# Patient Record
Sex: Female | Born: 1955 | Hispanic: No | Marital: Married | Smoking: Never smoker
Health system: Southern US, Community
[De-identification: ages and names within clinical notes are randomized; demographics above are authoritative.]

## PROBLEM LIST (undated history)

## (undated) HISTORY — PX: NO PAST SURGERIES: SHX2092

## (undated) HISTORY — PX: COLONOSCOPY: SHX174

---

## 2004-05-22 ENCOUNTER — Ambulatory Visit (HOSPITAL_COMMUNITY): Admission: RE | Admit: 2004-05-22 | Discharge: 2004-05-22 | Payer: Self-pay | Admitting: Internal Medicine

## 2005-04-17 ENCOUNTER — Emergency Department (HOSPITAL_COMMUNITY): Admission: EM | Admit: 2005-04-17 | Discharge: 2005-04-17 | Payer: Self-pay | Admitting: Emergency Medicine

## 2006-05-08 IMAGING — CR DG CHEST 2V
2 series · 2 of 2 positions shown · non-contrast
Comparison: None.

CLINICAL DATA: Cough.  Upper respiratory infection.
 CHEST ? 2 VIEW:

[view not recorded (1 of 2)]
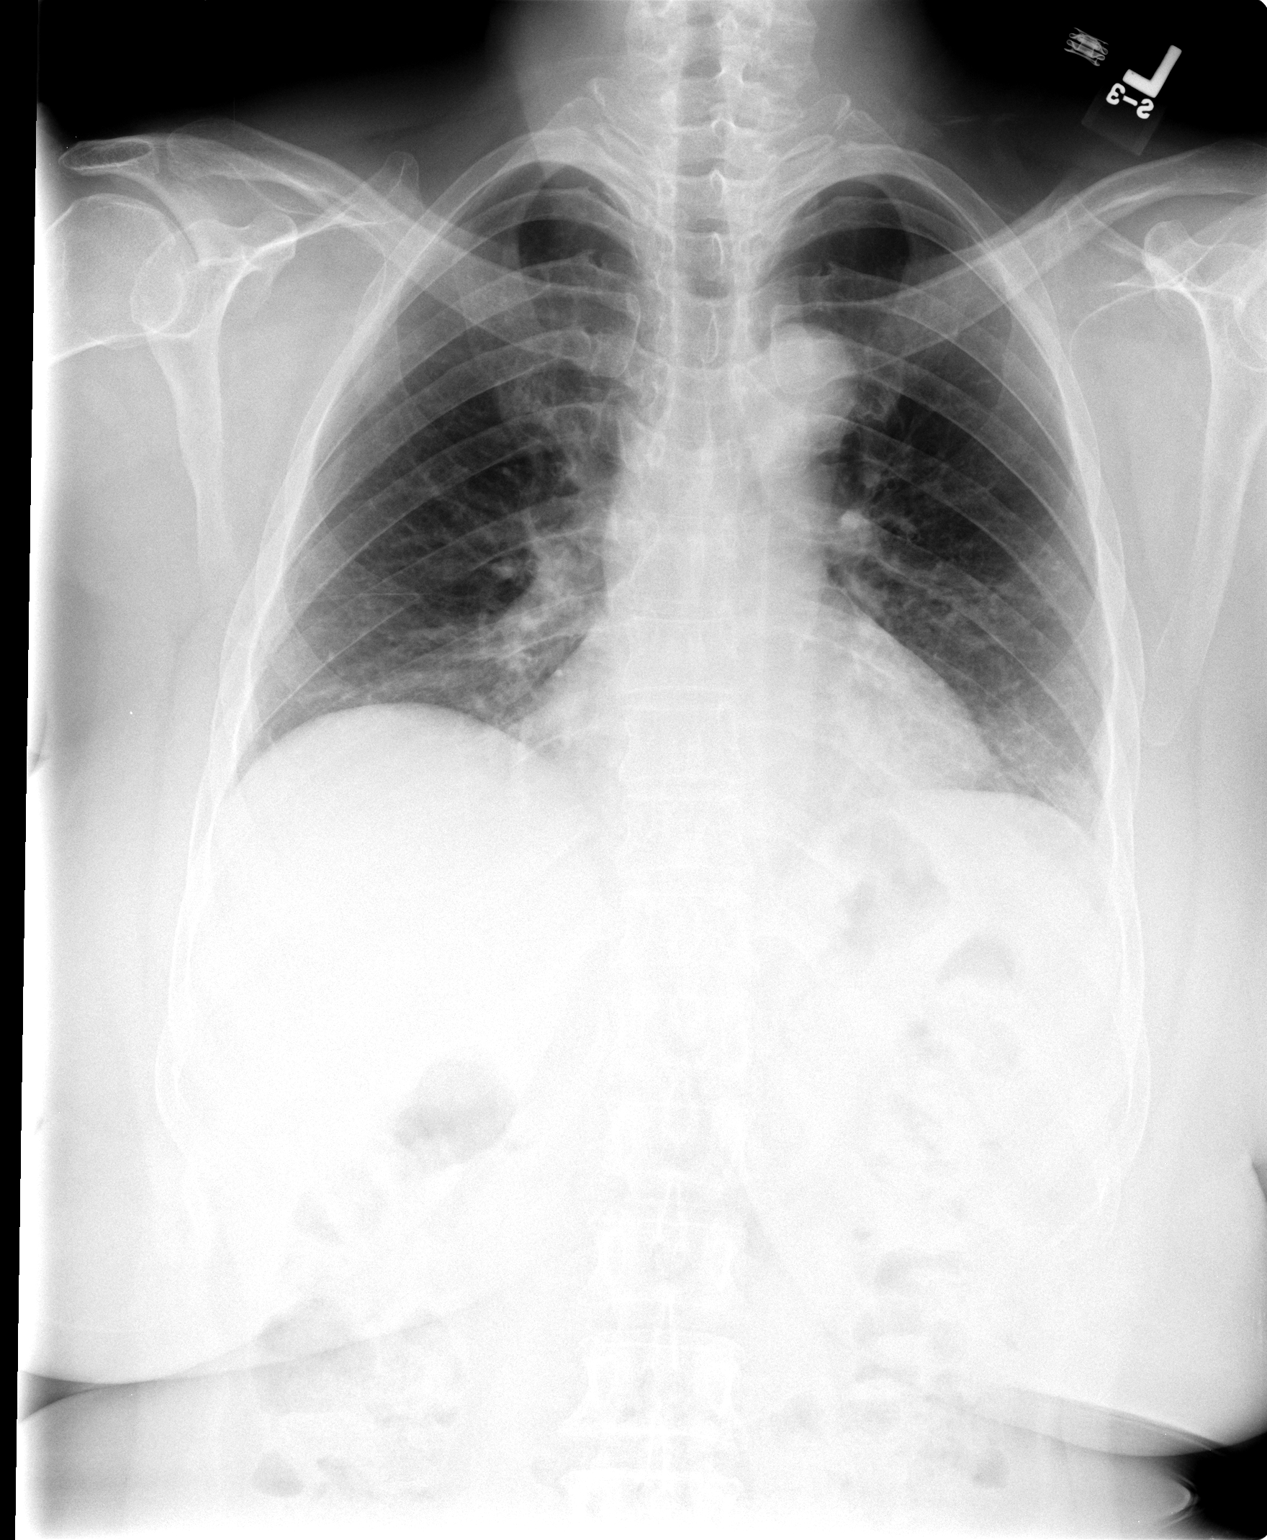

[view not recorded (2 of 2)]
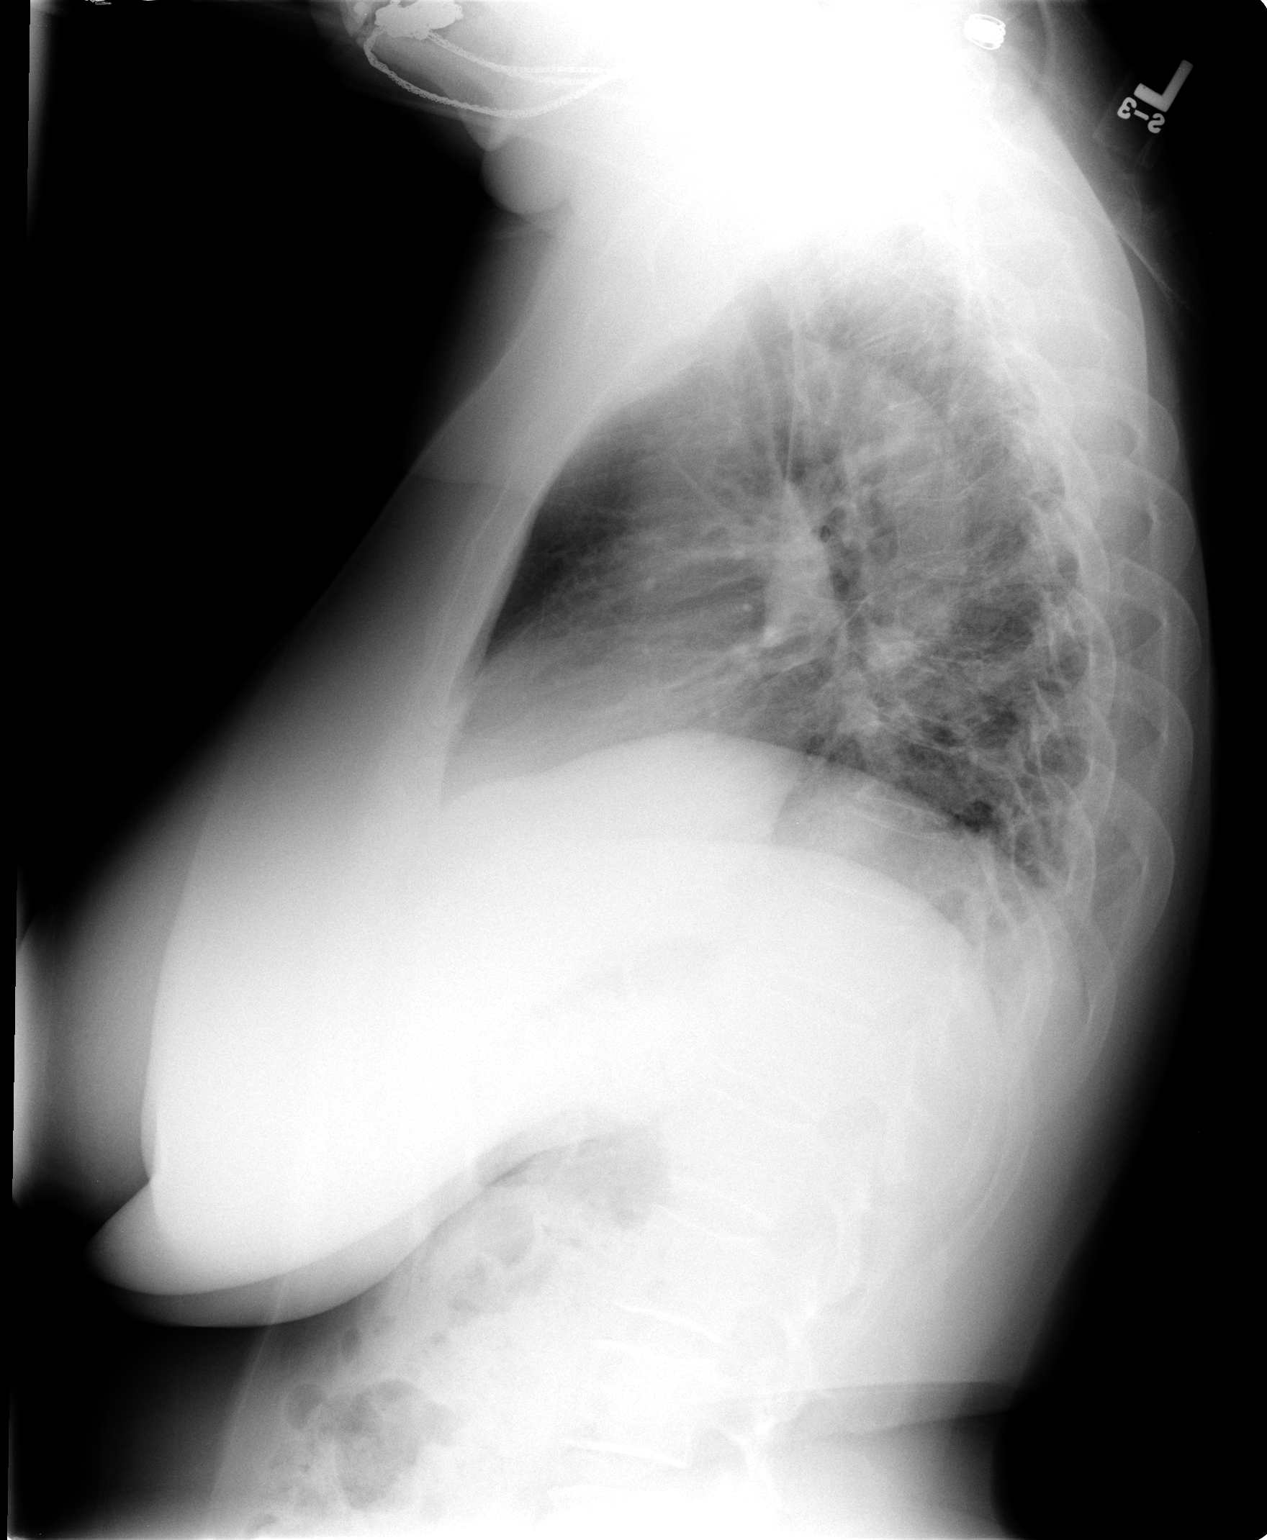

[2 of 2 positions shown; findings below may reference images not displayed]

FINDINGS: Low lung volumes are seen.  Mild bibasilar infiltrates versus atelectasis are noted.  There is no evidence of pleural effusion.  Heart size and mediastinal contours are within normal limits.
IMPRESSION: Low lung volumes with mild bibasilar atelectasis versus infiltrates.

## 2006-11-26 ENCOUNTER — Emergency Department (HOSPITAL_COMMUNITY): Admission: EM | Admit: 2006-11-26 | Discharge: 2006-11-26 | Payer: Self-pay | Admitting: Emergency Medicine

## 2010-05-05 ENCOUNTER — Encounter: Payer: Self-pay | Admitting: Internal Medicine

## 2014-05-02 ENCOUNTER — Emergency Department (HOSPITAL_COMMUNITY)
Admission: EM | Admit: 2014-05-02 | Discharge: 2014-05-02 | Disposition: A | Discharge status: Home / Self Care | Payer: Self-pay | Attending: Emergency Medicine | Admitting: Emergency Medicine

## 2014-05-02 ENCOUNTER — Encounter (HOSPITAL_COMMUNITY): Payer: Self-pay | Admitting: Family Medicine

## 2014-05-02 DIAGNOSIS — K5900 Constipation, unspecified: Secondary | ICD-10-CM | POA: Insufficient documentation

## 2014-05-02 DIAGNOSIS — K644 Residual hemorrhoidal skin tags: Secondary | ICD-10-CM | POA: Insufficient documentation

## 2014-05-02 DIAGNOSIS — Z79899 Other long term (current) drug therapy: Secondary | ICD-10-CM | POA: Insufficient documentation

## 2014-05-02 DIAGNOSIS — K625 Hemorrhage of anus and rectum: Secondary | ICD-10-CM | POA: Insufficient documentation

## 2014-05-02 LAB — COMPREHENSIVE METABOLIC PANEL
ALK PHOS: 52 U/L (ref 39–117)
ALT: 15 U/L (ref 0–35)
ANION GAP: 8 (ref 5–15)
AST: 21 U/L (ref 0–37)
Albumin: 3.7 g/dL (ref 3.5–5.2)
BILIRUBIN TOTAL: 0.6 mg/dL (ref 0.3–1.2)
BUN: 11 mg/dL (ref 6–23)
CHLORIDE: 103 meq/L (ref 96–112)
CO2: 28 mmol/L (ref 19–32)
CREATININE: 1.06 mg/dL (ref 0.50–1.10)
Calcium: 9.2 mg/dL (ref 8.4–10.5)
GFR, EST AFRICAN AMERICAN: 66 mL/min — AB (ref >90)
GFR, EST NON AFRICAN AMERICAN: 57 mL/min — AB (ref >90)
GLUCOSE: 120 mg/dL — AB (ref 70–99)
POTASSIUM: 4 mmol/L (ref 3.5–5.1)
Sodium: 139 mmol/L (ref 135–145)
TOTAL PROTEIN: 7.3 g/dL (ref 6.0–8.3)

## 2014-05-02 LAB — POC OCCULT BLOOD, ED: Fecal Occult Bld: POSITIVE — AB

## 2014-05-02 LAB — CBC
HCT: 42.4 % (ref 36.0–46.0)
HEMOGLOBIN: 14.4 g/dL (ref 12.0–15.0)
MCH: 28.9 pg (ref 26.0–34.0)
MCHC: 34 g/dL (ref 30.0–36.0)
MCV: 85 fL (ref 78.0–100.0)
Platelets: 188 10*3/uL (ref 150–400)
RBC: 4.99 MIL/uL (ref 3.87–5.11)
RDW: 13.8 % (ref 11.5–15.5)
WBC: 5.7 10*3/uL (ref 4.0–10.5)

## 2014-05-02 MED ORDER — FENTANYL CITRATE 0.05 MG/ML IJ SOLN: 50.0000 ug | Freq: Once | INTRAMUSCULAR | Status: DC

## 2014-05-02 NOTE — ED Notes (Signed)
Pt here for mid abd pain and 2 episodes of rectal bleeding. Bright red.

## 2014-05-02 NOTE — ED Provider Notes (Signed)
CSN: [id number]     Arrival date & time 05/02/14  1137 History   First MD Initiated Contact with Patient 05/02/14 1257     Chief Complaint  Patient presents with  . Rectal Bleeding  . Abdominal Pain     (Consider location/radiation/quality/duration/timing/severity/associated sxs/prior Treatment) Patient is a 59 y.o. female presenting with hematochezia and abdominal pain.  Rectal Bleeding Associated symptoms: no abdominal pain, no dizziness, no fever and no vomiting   Abdominal Pain Associated symptoms: constipation and hematochezia   Associated symptoms: no chest pain, no diarrhea, no dysuria, no fever, no nausea, no shortness of breath and no vomiting    Kristin Waller is a 59 year old female with no known past medical history who presents to the ER complaining of rectal bleeding. Patient reports one episode of bright red blood in the toilet this morning in addition to formed stool. She reports a second bowel movement after where she did not notice any blood. Patient reports a mild discomfort in her middle abdomen, however does not report any abdominal pain, nausea, vomiting, diarrhea, fever, weakness, lightheadedness, dizziness, chest pain, shortness of breath, dysuria. Patient reports she has been constipated for the past 2 days.  History reviewed. No pertinent past medical history. History reviewed. No pertinent past surgical history. History reviewed. No pertinent family history. History  Substance Use Topics  . Smoking status: Never Smoker   . Smokeless tobacco: Not on file  . Alcohol Use: No   OB History    No data available     Review of Systems  Constitutional: Negative for fever.  HENT: Negative for trouble swallowing.   Eyes: Negative for visual disturbance.  Respiratory: Negative for shortness of breath.   Cardiovascular: Negative for chest pain.  Gastrointestinal: Positive for constipation, blood in stool, hematochezia and anal bleeding. Negative for nausea,  vomiting, abdominal pain, diarrhea and rectal pain.  Genitourinary: Negative for dysuria.  Musculoskeletal: Negative for neck pain.  Skin: Negative for rash.  Neurological: Negative for dizziness, weakness and numbness.  Psychiatric/Behavioral: Negative.       Allergies  Review of patient's allergies indicates no known allergies.  Home Medications   Prior to Admission medications   Medication Sig Start Date End Date Taking? Authorizing Provider  Multiple Vitamins-Minerals (MULTIVITAMIN WITH MINERALS) tablet Take 1 tablet by mouth daily.   Yes Historical Provider, MD   BP 108/84 mmHg  Pulse 56  Temp(Src) 97.7 F (36.5 C) (Oral)  Resp 18  SpO2 97% Physical Exam  Constitutional: She is oriented to person, place, and time. She appears well-developed and well-nourished. No distress.  HENT:  Head: Normocephalic and atraumatic.  Mouth/Throat: Oropharynx is clear and moist. No oropharyngeal exudate.  Eyes: Right eye exhibits no discharge. Left eye exhibits no discharge. No scleral icterus.  Neck: Normal range of motion.  Cardiovascular: Normal rate, regular rhythm and normal heart sounds.   No murmur heard. Pulmonary/Chest: Effort normal and breath sounds normal. No respiratory distress.  Abdominal: Soft. There is no tenderness.  Genitourinary: Rectal exam shows external hemorrhoid. Rectal exam shows no internal hemorrhoid, no fissure, no mass, no tenderness and anal tone normal. Guaiac positive stool.  Obvious external hemorrhoids noted at 10:00 and 5:00 position the rectum. Red tint noted to stool without frank blood noted.  Musculoskeletal: Normal range of motion. She exhibits no edema or tenderness.  Neurological: She is alert and oriented to person, place, and time. She has normal strength. No cranial nerve deficit or sensory deficit. Coordination normal. GCS eye subscore  is 4. GCS verbal subscore is 5. GCS motor subscore is 6.  Patient fully alert answering questions  appropriately in full, clear sentences. Cranial nerves II through XII grossly intact. Motor strength 5 out of 5 in all major muscle groups of upper and lower extremities. Distal sensation intact.  Skin: Skin is warm and dry. No rash noted. She is not diaphoretic.  Psychiatric: She has a normal mood and affect.  Nursing note and vitals reviewed.   ED Course  Procedures (including critical care time) Labs Review Labs Reviewed  COMPREHENSIVE METABOLIC PANEL - Abnormal; Notable for the following:    Glucose, Bld 120 (*)    GFR calc non Af Amer 57 (*)    GFR calc Af Amer 66 (*)    All other components within normal limits  POC OCCULT BLOOD, ED - Abnormal; Notable for the following:    Fecal Occult Bld POSITIVE (*)    All other components within normal limits  CBC    Imaging Review No results found.   EKG Interpretation None      MDM   Final diagnoses:  Rectal bleeding    Patient here for evaluation of one episode of rectal bleeding. Patient was Hemoccult positive, although several external hemorrhoids noted on exam. Patient describes her blood as a bright red in color. Lab work reassuring. Patient without leukocytosis or anemia. Patient well-appearing, non-tachycardic, nontachypneic, non-hypoxic, afebrile and in no acute distress. No concern for SIRS or sepsis. No concern for acute abdomen. I spoke with Dr. Lucrezia Europe with [facility] who recommends patient to follow-up in their office on Thursday. I discussed this recommendation with patient, and patient was agreeable to this plan. I also discussed return precautions with patient, and patient and her daughter verbalize understanding and agreement. I encouraged them to call or return to the ER should she have any worsening of symptoms or should they have any questions or concerns.  Patient seen and discussed with Dr. Geoffery Lyons, MD  BP 108/84 mmHg  Pulse 56  Temp(Src) 97.7 F (36.5 C) (Oral)  Resp 18  SpO2 97%  Signed,   Ladona Mow, PA-C 5:08 PM   Monte Fantasia, PA-C 05/02/14 1708  Geoffery Lyons, MD 05/03/14 1002

## 2014-05-02 NOTE — Discharge Instructions (Signed)
Follow-up with gastroenterology on Thursday, 05/05/14 at 9:30 AM. Return to the ER if any weakness, dizziness, headache, severe abdominal pain, severe bleeding, nausea, vomiting, diarrhea, high fever.  Rectal Bleeding Rectal bleeding is when blood passes out of the anus. It is usually a sign that something is wrong. It may not be serious, but it should always be evaluated. Rectal bleeding may present as bright red blood or extremely dark stools. The color may range from dark red or maroon to black (like tar). It is important that the cause of rectal bleeding be identified so treatment can be started and the problem corrected. CAUSES   Hemorrhoids. These are enlarged (dilated) blood vessels or veins in the anal or rectal area.  Fistulas. Theseare abnormal, burrowing channels that usually run from inside the rectum to the skin around the anus. They can bleed.  Anal fissures. This is a tear in the tissue of the anus. Bleeding occurs with bowel movements.  Diverticulosis. This is a condition in which pockets or sacs project from the bowel wall. Occasionally, the sacs can bleed.  Diverticulitis. Thisis an infection involving diverticulosis of the colon.  Proctitis and colitis. These are conditions in which the rectum, colon, or both, can become inflamed and pitted (ulcerated).  Polyps and cancer. Polyps are non-cancerous (benign) growths in the colon that may bleed. Certain types of polyps turn into cancer.  Protrusion of the rectum. Part of the rectum can project from the anus and bleed.  Certain medicines.  Intestinal infections.  Blood vessel abnormalities. HOME CARE INSTRUCTIONS  Eat a high-fiber diet to keep your stool soft.  Limit activity.  Drink enough fluids to keep your urine clear or pale yellow.  Warm baths may be useful to soothe rectal pain.  Follow up with your caregiver as directed. SEEK IMMEDIATE MEDICAL CARE IF:  You develop increased bleeding.  You have black  or dark red stools.  You vomit blood or material that looks like coffee grounds.  You have abdominal pain or tenderness.  You have a fever.  You feel weak, nauseous, or you faint.  You have severe rectal pain or you are unable to have a bowel movement. MAKE SURE YOU:  Understand these instructions.  Will watch your condition.  Will get help right away if you are not doing well or get worse. Document Released: 09/21/2001 Document Revised: 06/24/2011 Document Reviewed: 09/16/2010 [facility] Patient Information 2015 [place]. This information is not intended to replace advice given to you by your health care provider. Make sure you discuss any questions you have with your health care provider.

## 2014-05-05 ENCOUNTER — Ambulatory Visit (INDEPENDENT_AMBULATORY_CARE_PROVIDER_SITE_OTHER): Payer: Self-pay | Admitting: Physician Assistant

## 2014-05-05 ENCOUNTER — Encounter: Payer: Self-pay | Admitting: Physician Assistant

## 2014-05-05 VITALS — BP 118/78 | HR 60 | Ht 62.5 in | Wt 179.2 lb

## 2014-05-05 DIAGNOSIS — K625 Hemorrhage of anus and rectum: Secondary | ICD-10-CM

## 2014-05-05 DIAGNOSIS — K649 Unspecified hemorrhoids: Secondary | ICD-10-CM

## 2014-05-05 MED ORDER — MOVIPREP 100 G PO SOLR: 1.0000 | Freq: Once | ORAL | Status: DC

## 2014-05-05 MED ORDER — HYDROCORTISONE 2.5 % RE CREA: 1.0000 "application " | TOPICAL_CREAM | Freq: Two times a day (BID) | RECTAL | Status: DC

## 2014-05-05 NOTE — Progress Notes (Signed)
Reviewed and agree with management. Robert D. Kaplan, M.D., FACG  

## 2014-05-05 NOTE — Patient Instructions (Addendum)
You have been scheduled for a colon on 05-09-14.  We sent medication to your pharmacy and please use Tucks wipes as needed.     Hemorrhoids Hemorrhoids are swollen veins around the rectum or anus. There are two types of hemorrhoids:   Internal hemorrhoids. These occur in the veins just inside the rectum. They may poke through to the outside and become irritated and painful.  External hemorrhoids. These occur in the veins outside the anus and can be felt as a painful swelling or hard lump near the anus. CAUSES  Pregnancy.   Obesity.   Constipation or diarrhea.   Straining to have a bowel movement.   Sitting for long periods on the toilet.  Heavy lifting or other activity that caused you to strain.  Anal intercourse. SYMPTOMS   Pain.   Anal itching or irritation.   Rectal bleeding.   Fecal leakage.   Anal swelling.   One or more lumps around the anus.  DIAGNOSIS  Your caregiver may be able to diagnose hemorrhoids by visual examination. Other examinations or tests that may be performed include:   Examination of the rectal area with a gloved hand (digital rectal exam).   Examination of anal canal using a small tube (scope).   A blood test if you have lost a significant amount of blood.  A test to look inside the colon (sigmoidoscopy or colonoscopy). TREATMENT Most hemorrhoids can be treated at home. However, if symptoms do not seem to be getting better or if you have a lot of rectal bleeding, your caregiver may perform a procedure to help make the hemorrhoids get smaller or remove them completely. Possible treatments include:   Placing a rubber band at the base of the hemorrhoid to cut off the circulation (rubber band ligation).   Injecting a chemical to shrink the hemorrhoid (sclerotherapy).   Using a tool to burn the hemorrhoid (infrared light therapy).   Surgically removing the hemorrhoid (hemorrhoidectomy).   Stapling the hemorrhoid to  block blood flow to the tissue (hemorrhoid stapling).  HOME CARE INSTRUCTIONS   Eat foods with fiber, such as whole grains, beans, nuts, fruits, and vegetables. Ask your doctor about taking products with added fiber in them (fibersupplements).  Increase fluid intake. Drink enough water and fluids to keep your urine clear or pale yellow.   Exercise regularly.   Go to the bathroom when you have the urge to have a bowel movement. Do not wait.   Avoid straining to have bowel movements.   Keep the anal area dry and clean. Use wet toilet paper or moist towelettes after a bowel movement.   Medicated creams and suppositories may be used or applied as directed.   Only take over-the-counter or prescription medicines as directed by your caregiver.   Take warm sitz baths for 15-20 minutes, 3-4 times a day to ease pain and discomfort.   Place ice packs on the hemorrhoids if they are tender and swollen. Using ice packs between sitz baths may be helpful.   Put ice in a plastic bag.   Place a towel between your skin and the bag.   Leave the ice on for 15-20 minutes, 3-4 times a day.   Do not use a donut-shaped pillow or sit on the toilet for long periods. This increases blood pooling and pain.  SEEK MEDICAL CARE IF:  You have increasing pain and swelling that is not controlled by treatment or medicine.  You have uncontrolled bleeding.  You have difficulty or  you are unable to have a bowel movement.  You have pain or inflammation outside the area of the hemorrhoids. MAKE SURE YOU:  Understand these instructions.  Will watch your condition.  Will get help right away if you are not doing well or get worse. Document Released: 03/29/2000 Document Revised: 03/18/2012 Document Reviewed: 02/04/2012 [facility] Patient Information 2015 [place], [place]. This information is not intended to replace advice given to you by your health care provider. Make sure you discuss any questions  you have with your health care provider.

## 2014-05-05 NOTE — Progress Notes (Signed)
Patient ID: Kristin Waller, female   DOB: 1956/01/12, 59 y.o.   MRN: [id number]    HPI:    Kristin Waller is Waller 59 year old [place] female referred for evaluation by Dr. Stark Jock due to rectal bleeding.  Patient speaks broken [place] and is accompanied by her daughter who translates for her. Patient has no past medical history and presented to the [facility] emergency room on January 19 with complaints of rectal bleeding. The patient states that 2 weeks prior she had had Waller bowel movement and had bright red blood on the toilet tissue. On the 19th she had Waller bowel movements and says blood filled the toilet bowl. She had no rectal pain, itching, or burning, she does have Waller sensation of incomplete evacuation. She has no abdominal pain. She has had no change in her bowel habits or stool caliber. There is no known family history of colon cancer, colon polyps, or inflammatory bowel disease. Tight has been good and her weight has been stable.     Past Surgical History  Procedure Laterality Date  . No past surgeries     Family History  Problem Relation Age of Onset  . Colon cancer Neg Hx   . Colon polyps Neg Hx   . Diabetes Neg Hx   . Kidney disease Neg Hx   . Gallbladder disease Neg Hx   . Esophageal cancer Neg Hx    History  Substance Use Topics  . Smoking status: Never Smoker   . Smokeless tobacco: Never Used  . Alcohol Use: No   Current Outpatient Prescriptions  Medication Sig Dispense Refill  . Multiple Vitamins-Minerals (MULTIVITAMIN WITH MINERALS) tablet Take 1 tablet by mouth daily.    . hydrocortisone (ANUSOL-HC) 2.5 % rectal cream Place 1 application rectally 2 (two) times daily. 30 g 1  . MOVIPREP 100 G SOLR Take 1 kit (200 g total) by mouth once. 1 kit 0   No current facility-administered medications for this visit.   No Known Allergies   Review of Systems: Gen: Denies any fever, chills, sweats, anorexia, fatigue, weakness, malaise, weight loss, and sleep disorder CV: Denies  chest pain, angina, palpitations, syncope, orthopnea, PND, peripheral edema, and claudication. Resp: Denies dyspnea at rest, dyspnea with exercise, cough, sputum, wheezing, coughing up blood, and pleurisy. GI: Denies vomiting blood, jaundice, and fecal incontinence.   Denies dysphagia or odynophagia. GU : Denies urinary burning, blood in urine, urinary frequency, urinary hesitancy, nocturnal urination, and urinary incontinence. MS: Denies joint pain, limitation of movement, and swelling, stiffness, low back pain, extremity pain. Denies muscle weakness, cramps, atrophy.  Derm: Denies rash, itching, dry skin, hives, moles, warts, or unhealing ulcers.  Psych: Denies depression, anxiety, memory loss, suicidal ideation, hallucinations, paranoia, and confusion. Heme: Denies bruising, bleeding, and enlarged lymph nodes. Neuro:  Denies any headaches, dizziness, paresthesias. Endo:  Denies any problems with DM, thyroid, adrenal function  Studies: No results found.  LAB RESULTS:  Recent Labs  05/02/14 1144  WBC 5.7  HGB 14.4  HCT 42.4  PLT 188   BMET  Recent Labs  05/02/14 1144  NA 139  K 4.0  CL 103  CO2 28  GLUCOSE 120*  BUN 11  CREATININE 1.06  CALCIUM 9.2   LFT  Recent Labs  05/02/14 1144  PROT 7.3  ALBUMIN 3.7  AST 21  ALT 15  ALKPHOS 52  BILITOT 0.6      Physical Exam: BP 118/78 mmHg  Pulse 60  Ht 5' 2.5" (1.588  m)  Wt 179 lb 4 oz (81.307 kg)  BMI 32.24 kg/m2 Constitutional: Pleasant,well-developed female in no acute distress. HEENT: Normocephalic and atraumatic. Conjunctivae are normal. No scleral icterus. Neck supple. No thyromegaly Cardiovascular: Normal rate, regular rhythm.  Pulmonary/chest: Effort normal and breath sounds normal. No wheezing, rales or rhonchi. Abdominal: Soft, nondistended, nontender. Bowel sounds active throughout. There are no masses palpable. No hepatomegaly. Rectal: external skin tag and hemorrhoid, small internal hemorrhoid,  brown stool tests heme positive Extremities: no edema Lymphadenopathy: No cervical adenopathy noted. Neurological: Alert and oriented to person place and time. Skin: Skin is warm and dry. No rashes noted. Psychiatric: Normal mood and affect. Behavior is normal.  ASSESSMENT AND PLAN: 59 year old female status post an episode of rectal bleeding found to have internal and external hemorrhoids and heme-positive stools referred for evaluation. She has been instructed to use Tucks wipes after bowel movements. She will be given Waller trial of Anusol HC suppositories 1 per rectum twice Waller day for 10 days. She will be scheduled for colonoscopy to screen for polyps, neoplasia, or inflammatory bowel disease.The risks, benefits, and alternatives to colonoscopy with possible biopsy and possible polypectomy were discussed with the patient and they consent to proceed. The procedure will be scheduled with Dr. Deatra Ina. Further recommendations will be made pending the findings of her colonoscopy.    ,  P PA-C 05/05/2014, 11:00 AM

## 2014-05-09 ENCOUNTER — Encounter: Payer: Self-pay | Admitting: Gastroenterology

## 2014-05-09 ENCOUNTER — Ambulatory Visit (AMBULATORY_SURGERY_CENTER): Payer: Self-pay | Admitting: Gastroenterology

## 2014-05-09 VITALS — BP 104/60 | HR 53 | Temp 98.1°F | Resp 22 | Ht 62.0 in | Wt 179.0 lb

## 2014-05-09 DIAGNOSIS — K625 Hemorrhage of anus and rectum: Secondary | ICD-10-CM

## 2014-05-09 DIAGNOSIS — K648 Other hemorrhoids: Secondary | ICD-10-CM

## 2014-05-09 DIAGNOSIS — K642 Third degree hemorrhoids: Secondary | ICD-10-CM

## 2014-05-09 MED ORDER — SODIUM CHLORIDE 0.9 % IV SOLN: 500.0000 mL | INTRAVENOUS | Status: DC

## 2014-05-09 NOTE — Progress Notes (Signed)
Procedure ends, to recovery, report given and VSS. 

## 2014-05-09 NOTE — Patient Instructions (Signed)
Handouts given on hemorrhoids. Repeat colonoscopy in 10 years.   YOU HAD AN ENDOSCOPIC PROCEDURE TODAY AT THE [facility]: Refer to the procedure report that was given to you for any specific questions about what was found during the examination.  If the procedure report does not answer your questions, please call your gastroenterologist to clarify.  If you requested that your care partner not be given the details of your procedure findings, then the procedure report has been included in a sealed envelope for you to review at your convenience later.  YOU SHOULD EXPECT: Some feelings of bloating in the abdomen. Passage of more gas than usual.  Walking can help get rid of the air that was put into your GI tract during the procedure and reduce the bloating. If you had a lower endoscopy (such as a colonoscopy or flexible sigmoidoscopy) you may notice spotting of blood in your stool or on the toilet paper. If you underwent a bowel prep for your procedure, then you may not have a normal bowel movement for a few days.  DIET: Your first meal following the procedure should be a light meal and then it is ok to progress to your normal diet.  A half-sandwich or bowl of soup is an example of a good first meal.  Heavy or fried foods are harder to digest and may make you feel nauseous or bloated.  Likewise meals heavy in dairy and vegetables can cause extra gas to form and this can also increase the bloating.  Drink plenty of fluids but you should avoid alcoholic beverages for 24 hours.  ACTIVITY: Your care partner should take you home directly after the procedure.  You should plan to take it easy, moving slowly for the rest of the day.  You can resume normal activity the day after the procedure however you should NOT DRIVE or use heavy machinery for 24 hours (because of the sedation medicines used during the test).    SYMPTOMS TO REPORT IMMEDIATELY: A gastroenterologist can be reached at any hour.  During  normal business hours, 8:30 AM to 5:00 PM Monday through Friday, call [phone].  After hours and on weekends, please call the GI answering service at [phone] who will take a message and have the physician on call contact you.   Following lower endoscopy (colonoscopy or flexible sigmoidoscopy):  Excessive amounts of blood in the stool  Significant tenderness or worsening of abdominal pains  Swelling of the abdomen that is new, acute  Fever of 100F or higher  Following upper endoscopy (EGD)  Vomiting of blood or coffee ground material  New chest pain or pain under the shoulder blades  Painful or persistently difficult swallowing  New shortness of breath  Fever of 100F or higher  Black, tarry-looking stools  FOLLOW UP: If any biopsies were taken you will be contacted by phone or by letter within the next 1-3 weeks.  Call your gastroenterologist if you have not heard about the biopsies in 3 weeks.  Our staff will call the home number listed on your records the next business day following your procedure to check on you and address any questions or concerns that you may have at that time regarding the information given to you following your procedure. This is a courtesy call and so if there is no answer at the home number and we have not heard from you through the emergency physician on call, we will assume that you have returned to your  regular daily activities without incident.  SIGNATURES/CONFIDENTIALITY: You and/or your care partner have signed paperwork which will be entered into your electronic medical record.  These signatures attest to the fact that that the information above on your After Visit Summary has been reviewed and is understood.  Full responsibility of the confidentiality of this discharge information lies with you and/or your care-partner.

## 2014-05-09 NOTE — Op Note (Signed)
[facility] [address] [place], [id number]   COLONOSCOPY PROCEDURE REPORT  PATIENT: Kristin Waller, Kristin Waller  MR#: [id number] BIRTHDATE: March 29, 1956 , 58  yrs. old GENDER: female ENDOSCOPIST: Louis Meckelobert D , MD REFERRED BY: PROCEDURE DATE:  05/09/2014 PROCEDURE:   Colonoscopy, diagnostic First Screening Colonoscopy - Avg.  risk and is 50 yrs.  old or older Yes.  Prior Negative Screening - Now for repeat screening. N/A  History of Adenoma - Now for follow-up colonoscopy & has been > or = to 3 yrs.  N/A  Polyps Removed Today? No.  Recommend repeat exam, <10 yrs? No. ASA CLASS:   Class I INDICATIONS:anal bleeding. MEDICATIONS: Monitored anesthesia care and Propofol 180 mg IV  DESCRIPTION OF PROCEDURE:   After the risks benefits and alternatives of the procedure were thoroughly explained, informed consent was obtained.  The digital rectal exam revealed hemorrhoids, Grade III.   The LB [id number]  endoscope was introduced through the anus and advanced to the cecum, which was identified by both the appendix and ileocecal valve. No adverse events experienced.   The quality of the prep was excellent using Suprep  The instrument was then slowly withdrawn as the colon was fully examined.      COLON FINDINGS: Internal Grade III hemorrhoids were found.   The examination was otherwise normal.  Retroflexed views revealed no abnormalities. The time to cecum=2 minutes 27 seconds.  Withdrawal time=6 minutes 01 seconds.  The scope was withdrawn and the procedure completed. COMPLICATIONS: There were no immediate complications.  ENDOSCOPIC IMPRESSION: 1.   Internal Grade III hemorrhoids 2.   The examination was otherwise normal  RECOMMENDATIONS: Anusol suppositories To consider band ligation of internal hemorrhoids for persistent bleeding Colonoscopy 10 years  eSigned:  Louis Meckelobert D , MD 05/09/2014 3:38 PM   cc:   PATIENT NAME:  Kristin Waller, Kristin Waller MR#:  [id number]

## 2014-05-10 ENCOUNTER — Telehealth: Payer: Self-pay | Admitting: *Deleted

## 2014-05-10 NOTE — Telephone Encounter (Signed)
  Follow up Call-no answer, left message to call if questions or concerns.     

## 2014-10-13 ENCOUNTER — Ambulatory Visit
Admission: RE | Admit: 2014-10-13 | Discharge: 2014-10-13 | Disposition: A | Discharge status: Home / Self Care | Payer: No Typology Code available for payment source | Source: Ambulatory Visit | Attending: Infectious Disease | Admitting: Infectious Disease

## 2014-10-13 ENCOUNTER — Other Ambulatory Visit: Payer: Self-pay | Admitting: Infectious Disease

## 2014-10-13 DIAGNOSIS — R7611 Nonspecific reaction to tuberculin skin test without active tuberculosis: Secondary | ICD-10-CM

## 2015-11-03 IMAGING — CR DG CHEST 1V
1 series · 1 of 1 positions shown · non-contrast
Comparison: PA and lateral chest of April 17, 2005

CLINICAL DATA: Positive PPD, asymptomatic, nonsmoker.

EXAM:
CHEST  1 VIEW

[w chest pa]
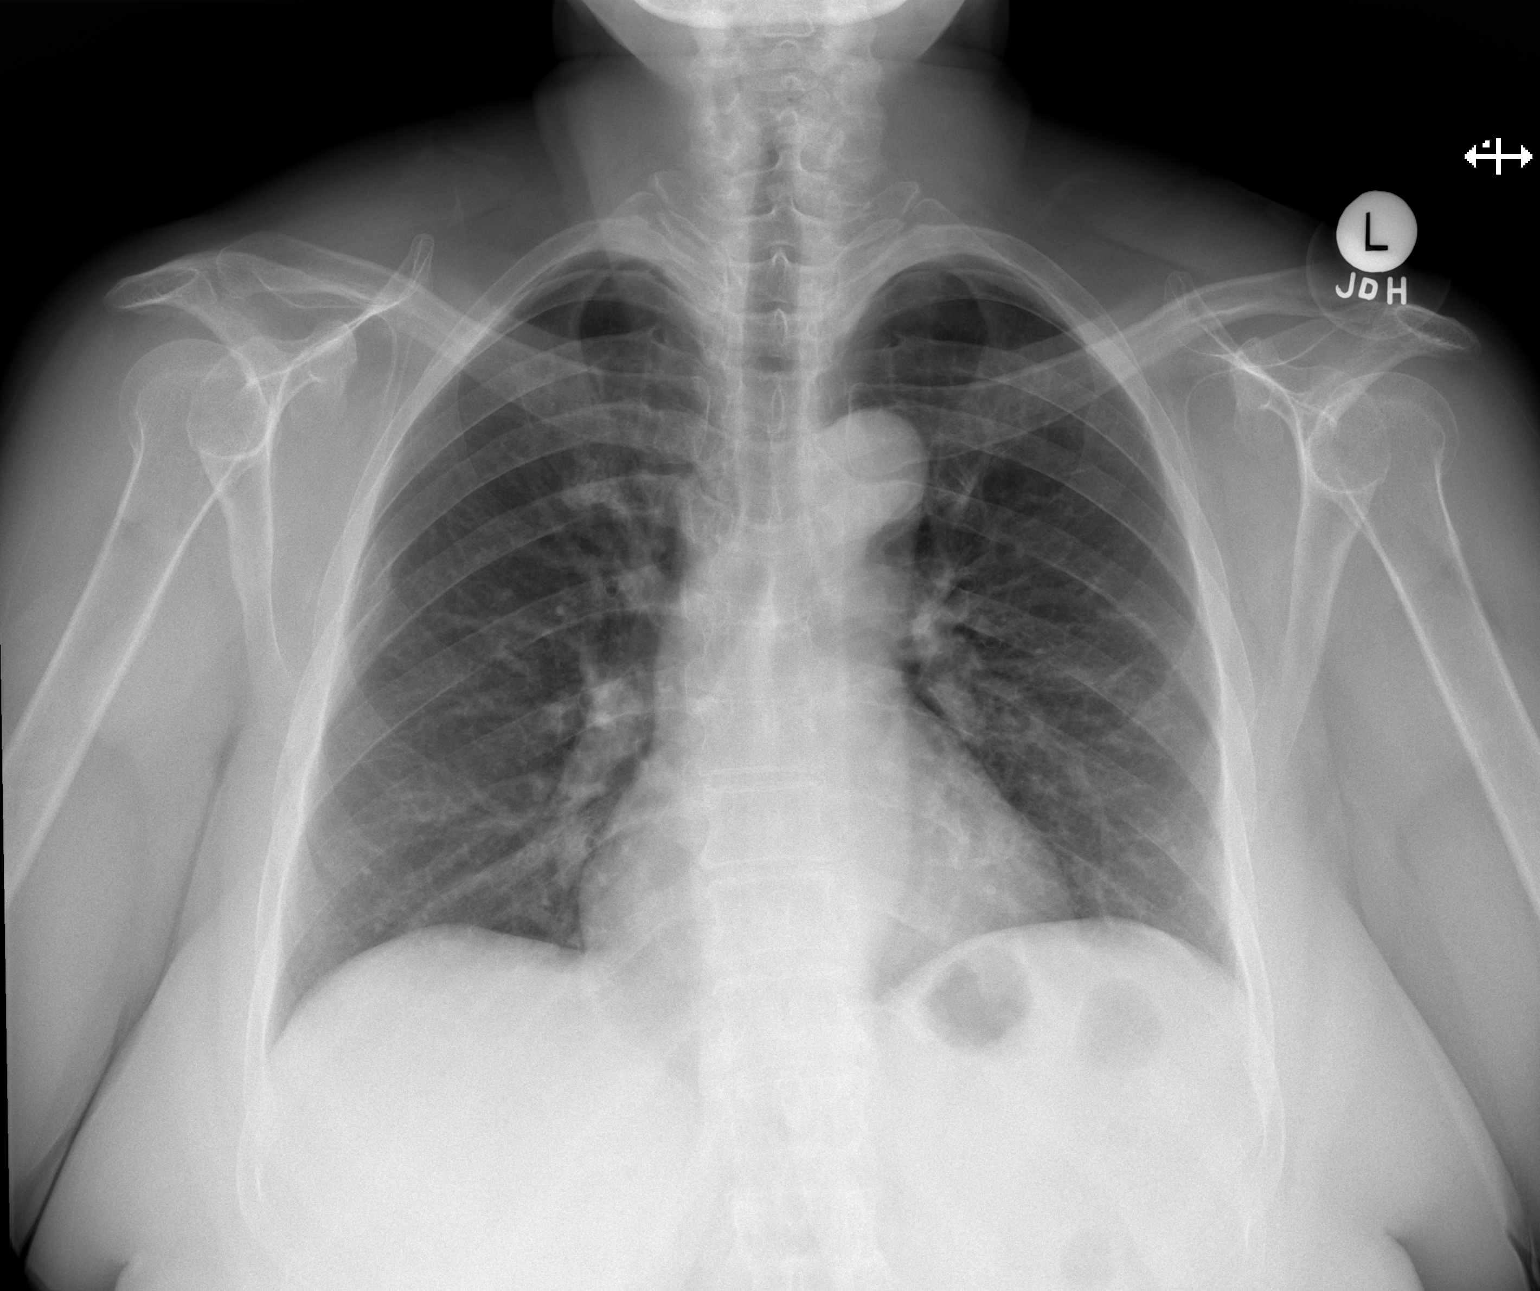

[1 of 1 positions shown; findings below may reference images not displayed]

FINDINGS: The lungs are adequately inflated. There is no focal infiltrate.
There is no pleural effusion. The heart and pulmonary vascularity
are normal. The mediastinum is normal in width. There is mild
tortuosity of the descending thoracic aorta. The bony thorax is
unremarkable.
IMPRESSION: There is no evidence of acute or old tuberculous infection nor other
active cardiopulmonary disease.

## 2019-04-06 ENCOUNTER — Encounter (HOSPITAL_COMMUNITY): Payer: Self-pay | Admitting: Family Medicine

## 2019-04-06 ENCOUNTER — Other Ambulatory Visit: Payer: Self-pay

## 2019-04-06 ENCOUNTER — Ambulatory Visit (HOSPITAL_COMMUNITY)
Admission: EM | Admit: 2019-04-06 | Discharge: 2019-04-06 | Disposition: A | Discharge status: Home / Self Care | Payer: Self-pay | Attending: Family Medicine | Admitting: Family Medicine

## 2019-04-06 DIAGNOSIS — R509 Fever, unspecified: Secondary | ICD-10-CM | POA: Insufficient documentation

## 2019-04-06 DIAGNOSIS — J3489 Other specified disorders of nose and nasal sinuses: Secondary | ICD-10-CM | POA: Insufficient documentation

## 2019-04-06 DIAGNOSIS — R05 Cough: Secondary | ICD-10-CM | POA: Insufficient documentation

## 2019-04-06 DIAGNOSIS — Z20828 Contact with and (suspected) exposure to other viral communicable diseases: Secondary | ICD-10-CM | POA: Insufficient documentation

## 2019-04-06 DIAGNOSIS — I10 Essential (primary) hypertension: Secondary | ICD-10-CM | POA: Insufficient documentation

## 2019-04-06 DIAGNOSIS — R0981 Nasal congestion: Secondary | ICD-10-CM | POA: Insufficient documentation

## 2019-04-06 NOTE — Discharge Instructions (Signed)
Try the things we spoke about to decrease blood pressure with diet and exercise. COVID testing done today based on symptoms. We will call with any positive results.  Keep monitoring your blood pressures at home and follow up with primary care.

## 2019-04-06 NOTE — ED Triage Notes (Signed)
Pt presents with elevated blood pressure X 1 week; pt has been experiencing dizziness and headaches.

## 2019-04-07 NOTE — ED Provider Notes (Signed)
[facility]    CSN: [id number] Arrival date & time: 04/06/19  1132      History   Chief Complaint Chief Complaint  Patient presents with  . Hypertension    HPI Kristin Waller is a 63 y.o. female.   Patient is a 63 year old female who presents today with concerns for elevated blood pressure.  She is also been experiencing mild dizziness and headaches that have been intermittent.  Has never been diagnosed with high blood pressure in the past.  She is also had mild cough and some nasal congestion with rhinorrhea.  Low-grade fever here today.  No known sick contacts.  No blurred vision, slurred speech, weakness, numbness or tingling.  No chest pain or shortness of breath.  No nausea, vomiting or diarrhea.  ROS per HPI    Hypertension    History reviewed. No pertinent past medical history.  There are no problems to display for this patient.   Past Surgical History:  Procedure Laterality Date  . NO PAST SURGERIES      OB History   No obstetric history on file.      Home Medications    Prior to Admission medications   Medication Sig Start Date End Date Taking? Authorizing Provider  Multiple Vitamins-Minerals (MULTIVITAMIN WITH MINERALS) tablet Take 1 tablet by mouth daily.    [provider]    Family History Family History  Problem Relation Age of Onset  . Colon cancer Neg Hx   . Colon polyps Neg Hx   . Diabetes Neg Hx   . Kidney disease Neg Hx   . Gallbladder disease Neg Hx   . Esophageal cancer Neg Hx     Social History Social History   Tobacco Use  . Smoking status: Never Smoker  . Smokeless tobacco: Never Used  Substance Use Topics  . Alcohol use: No    Alcohol/week: 0.0 standard drinks  . Drug use: No     Allergies   Patient has no known allergies.   Review of Systems Review of Systems   Physical Exam Triage Vital Signs ED Triage Vitals  Enc Vitals Group     BP 04/06/19 1152 (!) 144/83     Pulse Rate  04/06/19 1152 79     Resp 04/06/19 1152 16     Temp 04/06/19 1152 99.3 F (37.4 C)     Temp Source 04/06/19 1152 Oral     SpO2 04/06/19 1152 99 %     Weight --      Height --      Head Circumference --      Peak Flow --      Pain Score 04/06/19 1153 5     Pain Loc --      Pain Edu? --      Excl. in GC? --    No data found.  Updated Vital Signs BP (!) 144/83 (BP Location: Left Arm)   Pulse 79   Temp 99.3 F (37.4 C) (Oral)   Resp 16   SpO2 99%   Visual Acuity Right Eye Distance:   Left Eye Distance:   Bilateral Distance:    Right Eye Near:   Left Eye Near:    Bilateral Near:     Physical Exam Vitals and nursing note reviewed.  Constitutional:      General: She is not in acute distress.    Appearance: She is well-developed.  HENT:     Head: Normocephalic and atraumatic.  Eyes:  Conjunctiva/sclera: Conjunctivae normal.  Cardiovascular:     Rate and Rhythm: Normal rate and regular rhythm.     Heart sounds: No murmur.  Pulmonary:     Effort: Pulmonary effort is normal. No respiratory distress.     Breath sounds: Normal breath sounds.  Abdominal:     Palpations: Abdomen is soft.     Tenderness: There is no abdominal tenderness.  Musculoskeletal:     Cervical back: Neck supple.  Skin:    General: Skin is warm and dry.  Neurological:     Mental Status: She is alert.      UC Treatments / Results  Labs (all labs ordered are listed, but only abnormal results are displayed) Labs Reviewed  NOVEL CORONAVIRUS, NAA (HOSP ORDER, SEND-OUT TO REF LAB; TAT 18-24 HRS)    EKG   Radiology No results found.  Procedures Procedures (including critical care time)  Medications Ordered in UC Medications - No data to display  Initial Impression / Assessment and Plan / UC Course  I have reviewed the triage vital signs and the nursing notes.  Pertinent labs & imaging results that were available during my care of the patient were reviewed by me and considered in  my medical decision making (see chart for details).     Hypertension-patient here with concerns for elevated blood pressures over the past week.  Reporting highest at home has been in the 505L systolic.  Here today she is 143/83 I do not believe her symptoms are related to her blood pressure. Nothing concerning on exam. She has appointment to follow-up with primary care at the [facility] on January 11 They can follow-up with her blood pressure at that time. Recommended decrease salt, exercise and keep monitoring her blood pressures at home.  If symptoms worsen she will need to go the ER.  Concerned that her symptoms may be viral.  Covid swab done here in clinic and labs pending. Final Clinical Impressions(s) / UC Diagnoses   Final diagnoses:  Hypertension, unspecified type     Discharge Instructions     Try the things we spoke about to decrease blood pressure with diet and exercise. COVID testing done today based on symptoms. We will call with any positive results.  Keep monitoring your blood pressures at home and follow up with primary care.       ED Prescriptions    None     PDMP not reviewed this encounter.   Orvan July, NP 04/07/19 1005

## 2019-04-08 LAB — NOVEL CORONAVIRUS, NAA (HOSP ORDER, SEND-OUT TO REF LAB; TAT 18-24 HRS): SARS-CoV-2, NAA: NOT DETECTED

## 2019-04-26 ENCOUNTER — Ambulatory Visit (INDEPENDENT_AMBULATORY_CARE_PROVIDER_SITE_OTHER): Payer: Self-pay | Admitting: Primary Care

## 2019-04-26 ENCOUNTER — Telehealth (INDEPENDENT_AMBULATORY_CARE_PROVIDER_SITE_OTHER): Payer: Self-pay | Admitting: Primary Care

## 2019-04-26 DIAGNOSIS — R030 Elevated blood-pressure reading, without diagnosis of hypertension: Secondary | ICD-10-CM

## 2019-04-26 DIAGNOSIS — Z7689 Persons encountering health services in other specified circumstances: Secondary | ICD-10-CM

## 2019-04-26 NOTE — Progress Notes (Signed)
  Virtual Visit via Telephone Note  I connected with Kristin Waller on 04/26/19 at  1:50 PM EST by telephone and verified that I am speaking with the correct person using two identifiers.   I discussed the limitations, risks, security and privacy concerns of performing an evaluation and management service by telephone and the availability of in person appointments. I also discussed with the patient that there may be a patient responsible charge related to this service. The patient expressed understanding and agreed to proceed.   History of Present Illness: Kristin Waller is having a web visit that is not going well and had to be change to tele language barrier Kristin Waller -son will interpretor for [place] language . Concerns she has high blood pressure denies  shortness of breath, headaches, chest pain or lower extremity edema. Establishment of care.  No past medical history on file.  Current Outpatient Medications on File Prior to Visit  Medication Sig Dispense Refill  . Multiple Vitamins-Minerals (MULTIVITAMIN WITH MINERALS) tablet Take 1 tablet by mouth daily.     No current facility-administered medications on file prior to visit.    Observations/Objective: Review of Systems  All other systems reviewed and are negative.  Assessment and Plan: Diagnoses and all orders for this visit:  Encounter to establish care Gwinda Passe, NP-C will be your  (PCP) mastered prepared that is able to that will  diagnosed and treatment able to answer health concern as well as continuing care of varied medical conditions, not limited by cause, organ system, or diagnosis.   Elevated blood pressure reading in office without diagnosis of hypertension Blood pressure readings systolic 121-127 and diastolic 78-89. Discussed diet and exercise lifestyle modification blood pressure medication not warranted at this time  Follow Up Instructions:    I discussed the assessment and treatment plan with  the patient. The patient was provided an opportunity to ask questions and all were answered. The patient agreed with the plan and demonstrated an understanding of the instructions.   The patient was advised to call back or seek an in-person evaluation if the symptoms worsen or if the condition fails to improve as anticipated.  I provided 14 minutes of non-face-to-face time during this encounter.   Grayce Sessions, NP

## 2019-05-13 ENCOUNTER — Ambulatory Visit (INDEPENDENT_AMBULATORY_CARE_PROVIDER_SITE_OTHER): Payer: Self-pay | Admitting: Primary Care

## 2019-05-17 ENCOUNTER — Encounter (INDEPENDENT_AMBULATORY_CARE_PROVIDER_SITE_OTHER): Payer: Self-pay | Admitting: Primary Care

## 2019-05-17 ENCOUNTER — Other Ambulatory Visit: Payer: Self-pay

## 2019-05-17 ENCOUNTER — Ambulatory Visit (INDEPENDENT_AMBULATORY_CARE_PROVIDER_SITE_OTHER): Payer: Self-pay | Admitting: Primary Care

## 2019-05-17 VITALS — BP 130/81 | HR 53 | Temp 96.8°F | Ht 63.0 in | Wt 195.4 lb

## 2019-05-17 DIAGNOSIS — R030 Elevated blood-pressure reading, without diagnosis of hypertension: Secondary | ICD-10-CM

## 2019-05-17 DIAGNOSIS — Z6834 Body mass index (BMI) 34.0-34.9, adult: Secondary | ICD-10-CM

## 2019-05-17 DIAGNOSIS — Z1231 Encounter for screening mammogram for malignant neoplasm of breast: Secondary | ICD-10-CM

## 2019-05-17 DIAGNOSIS — E6609 Other obesity due to excess calories: Secondary | ICD-10-CM

## 2019-05-17 DIAGNOSIS — Z0000 Encounter for general adult medical examination without abnormal findings: Secondary | ICD-10-CM

## 2019-05-17 DIAGNOSIS — Z1159 Encounter for screening for other viral diseases: Secondary | ICD-10-CM

## 2019-05-17 DIAGNOSIS — Z114 Encounter for screening for human immunodeficiency virus [HIV]: Secondary | ICD-10-CM

## 2019-05-17 NOTE — Patient Instructions (Signed)
Health Maintenance, Female Adopting a healthy lifestyle and getting preventive care are important in promoting health and wellness. Ask your health care provider about:  The right schedule for you to have regular tests and exams.  Things you can do on your own to prevent diseases and keep yourself healthy. What should I know about diet, weight, and exercise? Eat a healthy diet   Eat a diet that includes plenty of vegetables, fruits, low-fat dairy products, and lean protein.  Do not eat a lot of foods that are high in solid fats, added sugars, or sodium. Maintain a healthy weight Body mass index (BMI) is used to identify weight problems. It estimates body fat based on height and weight. Your health care provider can help determine your BMI and help you achieve or maintain a healthy weight. Get regular exercise Get regular exercise. This is one of the most important things you can do for your health. Most adults should:  Exercise for at least 150 minutes each week. The exercise should increase your heart rate and make you sweat (moderate-intensity exercise).  Do strengthening exercises at least twice a week. This is in addition to the moderate-intensity exercise.  Spend less time sitting. Even light physical activity can be beneficial. Watch cholesterol and blood lipids Have your blood tested for lipids and cholesterol at 64 years of age, then have this test every 5 years. Have your cholesterol levels checked more often if:  Your lipid or cholesterol levels are high.  You are older than 64 years of age.  You are at high risk for heart disease. What should I know about cancer screening? Depending on your health history and family history, you may need to have cancer screening at various ages. This may include screening for:  Breast cancer.  Cervical cancer.  Colorectal cancer.  Skin cancer.  Lung cancer. What should I know about heart disease, diabetes, and high blood  pressure? Blood pressure and heart disease  High blood pressure causes heart disease and increases the risk of stroke. This is more likely to develop in people who have high blood pressure readings, are of African descent, or are overweight.  Have your blood pressure checked: ? Every 3-5 years if you are 18-39 years of age. ? Every year if you are 40 years old or older. Diabetes Have regular diabetes screenings. This checks your fasting blood sugar level. Have the screening done:  Once every three years after age 40 if you are at a normal weight and have a low risk for diabetes.  More often and at a younger age if you are overweight or have a high risk for diabetes. What should I know about preventing infection? Hepatitis B If you have a higher risk for hepatitis B, you should be screened for this virus. Talk with your health care provider to find out if you are at risk for hepatitis B infection. Hepatitis C Testing is recommended for:  Everyone born from 1945 through 1965.  Anyone with known risk factors for hepatitis C. Sexually transmitted infections (STIs)  Get screened for STIs, including gonorrhea and chlamydia, if: ? You are sexually active and are younger than 64 years of age. ? You are older than 64 years of age and your health care provider tells you that you are at risk for this type of infection. ? Your sexual activity has changed since you were last screened, and you are at increased risk for chlamydia or gonorrhea. Ask your health care provider if   you are at risk.  Ask your health care provider about whether you are at high risk for HIV. Your health care provider may recommend a prescription medicine to help prevent HIV infection. If you choose to take medicine to prevent HIV, you should first get tested for HIV. You should then be tested every 3 months for as long as you are taking the medicine. Pregnancy  If you are about to stop having your period (premenopausal) and  you may become pregnant, seek counseling before you get pregnant.  Take 400 to 800 micrograms (mcg) of folic acid every day if you become pregnant.  Ask for birth control (contraception) if you want to prevent pregnancy. Osteoporosis and menopause Osteoporosis is a disease in which the bones lose minerals and strength with aging. This can result in bone fractures. If you are 65 years old or older, or if you are at risk for osteoporosis and fractures, ask your health care provider if you should:  Be screened for bone loss.  Take a calcium or vitamin D supplement to lower your risk of fractures.  Be given hormone replacement therapy (HRT) to treat symptoms of menopause. Follow these instructions at home: Lifestyle  Do not use any products that contain nicotine or tobacco, such as cigarettes, e-cigarettes, and chewing tobacco. If you need help quitting, ask your health care provider.  Do not use street drugs.  Do not share needles.  Ask your health care provider for help if you need support or information about quitting drugs. Alcohol use  Do not drink alcohol if: ? Your health care provider tells you not to drink. ? You are pregnant, may be pregnant, or are planning to become pregnant.  If you drink alcohol: ? Limit how much you use to 0-1 drink a day. ? Limit intake if you are breastfeeding.  Be aware of how much alcohol is in your drink. In the [place] one drink equals one 12 oz bottle of beer (355 mL), one 5 oz glass of wine (148 mL), or one 1 oz glass of hard liquor (44 mL). General instructions  Schedule regular health, dental, and eye exams.  Stay current with your vaccines.  Tell your health care provider if: ? You often feel depressed. ? You have ever been abused or do not feel safe at home. Summary  Adopting a healthy lifestyle and getting preventive care are important in promoting health and wellness.  Follow your health care provider's instructions about healthy  diet, exercising, and getting tested or screened for diseases.  Follow your health care provider's instructions on monitoring your cholesterol and blood pressure. This information is not intended to replace advice given to you by your health care provider. Make sure you discuss any questions you have with your health care provider. Document Revised: 03/25/2018 Document Reviewed: 03/25/2018 Elsevier Patient Education  2020 Elsevier Inc.  

## 2019-05-17 NOTE — Progress Notes (Signed)
Established Patient Office Visit  Subjective:  Patient ID: Kristin Waller, female    DOB: July 06, 1955  Age: 64 y.o. MRN: [id number]  CC:  Chief Complaint  Patient presents with  . Annual Exam    HPI [name] presents for annual physical she will return for fasting labs. She voices no complaints or concerns.   No past medical history on file.  Past Surgical History:  Procedure Laterality Date  . NO PAST SURGERIES      Family History  Problem Relation Age of Onset  . Colon cancer Neg Hx   . Colon polyps Neg Hx   . Diabetes Neg Hx   . Kidney disease Neg Hx   . Gallbladder disease Neg Hx   . Esophageal cancer Neg Hx     Social History   Socioeconomic History  . Marital status: Married    Spouse name: Not on file  . Number of children: 3  . Years of education: Not on file  . Highest education level: Not on file  Occupational History  . Occupation: Homemaker  Tobacco Use  . Smoking status: Never Smoker  . Smokeless tobacco: Never Used  Substance and Sexual Activity  . Alcohol use: No    Alcohol/week: 0.0 standard drinks  . Drug use: No  . Sexual activity: Not on file  Other Topics Concern  . Not on file  Social History Narrative  . Not on file   Social Determinants of Health   Financial Resource Strain:   . Difficulty of Paying Living Expenses: Not on file  Food Insecurity:   . Worried About Charity fundraiser in the Last Year: Not on file  . Ran Out of Food in the Last Year: Not on file  Transportation Needs:   . Lack of Transportation (Medical): Not on file  . Lack of Transportation (Non-Medical): Not on file  Physical Activity:   . Days of Exercise per Week: Not on file  . Minutes of Exercise per Session: Not on file  Stress:   . Feeling of Stress : Not on file  Social Connections:   . Frequency of Communication with Friends and Family: Not on file  . Frequency of Social Gatherings with Friends and Family: Not on file  . Attends  Religious Services: Not on file  . Active Member of Clubs or Organizations: Not on file  . Attends Archivist Meetings: Not on file  . Marital Status: Not on file  Intimate Partner Violence:   . Fear of Current or Ex-Partner: Not on file  . Emotionally Abused: Not on file  . Physically Abused: Not on file  . Sexually Abused: Not on file    Outpatient Medications Prior to Visit  Medication Sig Dispense Refill  . Multiple Vitamins-Minerals (MULTIVITAMIN WITH MINERALS) tablet Take 1 tablet by mouth daily.     No facility-administered medications prior to visit.    No Known Allergies  ROS Review of Systems  All other systems reviewed and are negative.     Objective:    Physical Exam  Constitutional: She is oriented to person, place, and time. She appears well-developed and well-nourished.  HENT:  Head: Normocephalic.  Eyes: Pupils are equal, round, and reactive to light. EOM are normal.  Cardiovascular: Normal rate and regular rhythm.  Pulmonary/Chest: Effort normal and breath sounds normal.  Abdominal: Bowel sounds are normal. She exhibits distension.  Musculoskeletal:        General: Normal range of motion.  Cervical back: Normal range of motion and neck supple.  Neurological: She is oriented to person, place, and time. She has normal reflexes.  Skin: Skin is warm and dry.  Psychiatric: She has a normal mood and affect. Her behavior is normal. Thought content normal.    BP 130/81 (BP Location: Right Arm, Patient Position: Sitting, Cuff Size: Normal)   Pulse (!) 53   Temp (!) 96.8 F (36 C) (Temporal)   Ht '5\' 3"'$  (1.6 m)   Wt 195 lb 6.4 oz (88.6 kg)   SpO2 97%   BMI 34.61 kg/m  Wt Readings from Last 3 Encounters:  05/17/19 195 lb 6.4 oz (88.6 kg)  05/09/14 179 lb (81.2 kg)  05/05/14 179 lb 4 oz (81.3 kg)     Health Maintenance Due  Topic Date Due  . HIV Screening  08/19/1970  . MAMMOGRAM  08/18/2005    There are no preventive care reminders  to display for this patient.  No results found for: TSH Lab Results  Component Value Date   WBC 5.7 05/02/2014   HGB 14.4 05/02/2014   HCT 42.4 05/02/2014   MCV 85.0 05/02/2014   PLT 188 05/02/2014   Lab Results  Component Value Date   NA 139 05/02/2014   K 4.0 05/02/2014   CO2 28 05/02/2014   GLUCOSE 120 (H) 05/02/2014   BUN 11 05/02/2014   CREATININE 1.06 05/02/2014   BILITOT 0.6 05/02/2014   ALKPHOS 52 05/02/2014   AST 21 05/02/2014   ALT 15 05/02/2014   PROT 7.3 05/02/2014   ALBUMIN 3.7 05/02/2014   CALCIUM 9.2 05/02/2014   ANIONGAP 8 05/02/2014   No results found for: CHOL No results found for: HDL No results found for: LDLCALC No results found for: TRIG No results found for: CHOLHDL No results found for: HGBA1C    Assessment & Plan:  Kristin Waller was seen today for annual exam.  Diagnoses and all orders for this visit:  Elevated blood pressure reading in office without diagnosis of hypertension Counseled on blood pressure goal of less than 130/80, low-sodium, DASH diet, medication compliance, 150 minutes of moderate intensity exercise per week. -     CBC with Differential; Future -     CMP14+EGFR; Future -     Lipid Panel; Future  Annual physical exam Closed all health maintenance gaps, mammogram  except pap smear, TDAP and Influenza she declines. -     CBC with Differential; Future -     CMP14+EGFR; Future  Class 1 obesity due to excess calories without serious comorbidity with body mass index (BMI) of 34.0 to 34.9 in adult Obesity is 30-39 indicating an excess in caloric intake or underlining conditions. This may lead to other co-morbidities. Lifestyle modifications of diet and exercise may reduce obesity.   Encounter for screening mammogram for malignant neoplasm of breast Patient completed application for [facility] while in clinic and application has and faxed to [facility]. Patient aware that [facility] will contact her directly to schedule appointment.  Follow-up:  Return if symptoms worsen or fail to improve.    Kerin Perna, NP

## 2019-05-18 LAB — CBC WITH DIFFERENTIAL/PLATELET
Basophils Absolute: 0 10*3/uL (ref 0.0–0.2)
Basos: 0 %
EOS (ABSOLUTE): 0.1 10*3/uL (ref 0.0–0.4)
Eos: 2 %
Hematocrit: 41.9 % (ref 34.0–46.6)
Hemoglobin: 14.3 g/dL (ref 11.1–15.9)
Immature Grans (Abs): 0 10*3/uL (ref 0.0–0.1)
Immature Granulocytes: 0 %
Lymphocytes Absolute: 1.8 10*3/uL (ref 0.7–3.1)
Lymphs: 36 %
MCH: 29.6 pg (ref 26.6–33.0)
MCHC: 34.1 g/dL (ref 31.5–35.7)
MCV: 87 fL (ref 79–97)
Monocytes Absolute: 0.3 10*3/uL (ref 0.1–0.9)
Monocytes: 7 %
Neutrophils Absolute: 2.8 10*3/uL (ref 1.4–7.0)
Neutrophils: 55 %
Platelets: 192 10*3/uL (ref 150–450)
RBC: 4.83 x10E6/uL (ref 3.77–5.28)
RDW: 14.1 % (ref 11.7–15.4)
WBC: 5 10*3/uL (ref 3.4–10.8)

## 2019-05-18 LAB — CMP14+EGFR
ALT: 13 IU/L (ref 0–32)
AST: 20 IU/L (ref 0–40)
Albumin/Globulin Ratio: 1.6 (ref 1.2–2.2)
Albumin: 4.4 g/dL (ref 3.8–4.8)
Alkaline Phosphatase: 57 IU/L (ref 39–117)
BUN/Creatinine Ratio: 11 — ABNORMAL LOW (ref 12–28)
BUN: 12 mg/dL (ref 8–27)
Bilirubin Total: 0.3 mg/dL (ref 0.0–1.2)
CO2: 24 mmol/L (ref 20–29)
Calcium: 9.5 mg/dL (ref 8.7–10.3)
Chloride: 102 mmol/L (ref 96–106)
Creatinine, Ser: 1.07 mg/dL — ABNORMAL HIGH (ref 0.57–1.00)
GFR calc Af Amer: 64 mL/min/{1.73_m2} (ref >59)
GFR calc non Af Amer: 55 mL/min/{1.73_m2} — ABNORMAL LOW (ref >59)
Globulin, Total: 2.8 g/dL (ref 1.5–4.5)
Glucose: 133 mg/dL — ABNORMAL HIGH (ref 65–99)
Potassium: 4.2 mmol/L (ref 3.5–5.2)
Sodium: 141 mmol/L (ref 134–144)
Total Protein: 7.2 g/dL (ref 6.0–8.5)

## 2019-05-18 LAB — LIPID PANEL
Chol/HDL Ratio: 3.6 ratio (ref 0.0–4.4)
Cholesterol, Total: 214 mg/dL — ABNORMAL HIGH (ref 100–199)
HDL: 59 mg/dL (ref >39)
LDL Chol Calc (NIH): 140 mg/dL — ABNORMAL HIGH (ref 0–99)
Triglycerides: 84 mg/dL (ref 0–149)
VLDL Cholesterol Cal: 15 mg/dL (ref 5–40)

## 2019-05-18 LAB — HIV ANTIBODY (ROUTINE TESTING W REFLEX): HIV Screen 4th Generation wRfx: NONREACTIVE

## 2019-05-18 LAB — HEPATITIS C ANTIBODY: Hep C Virus Ab: <0.1 s/co ratio (ref 0.0–0.9)

## 2021-05-29 ENCOUNTER — Encounter (HOSPITAL_COMMUNITY): Payer: Self-pay | Admitting: Emergency Medicine

## 2021-05-29 ENCOUNTER — Ambulatory Visit (HOSPITAL_COMMUNITY)
Admission: EM | Admit: 2021-05-29 | Discharge: 2021-05-29 | Disposition: A | Discharge status: Home / Self Care | Payer: Self-pay | Attending: Emergency Medicine | Admitting: Emergency Medicine

## 2021-05-29 ENCOUNTER — Other Ambulatory Visit: Payer: Self-pay

## 2021-05-29 DIAGNOSIS — B029 Zoster without complications: Secondary | ICD-10-CM

## 2021-05-29 MED ORDER — VALACYCLOVIR HCL 1 G PO TABS: 1000.0000 mg | ORAL_TABLET | Freq: Three times a day (TID) | ORAL | 0 refills | Status: AC

## 2021-05-29 NOTE — Discharge Instructions (Addendum)
Today you are being treated for shingles, inside your packet is more information  Take valacyclovir three times a day for 7 days  Once your rash has cleared, please follow-up with your primary doctor to discuss the shingles vaccination to try to prevent recurrence  Your rash has appeared to have crusted over therefore you are no longer contagious

## 2021-05-29 NOTE — ED Triage Notes (Addendum)
Left lower abdominal pain, now radiating around torso to left lower back.  Denies urinary symptoms.  Denies bowel issues.  Patient has developed a rash in this area over the past week

## 2021-05-29 NOTE — ED Provider Notes (Signed)
[place]    CSN: [id number] Arrival date & time: 05/29/21  [id number]      History   Chief Complaint Chief Complaint  Patient presents with   Abdominal Pain    HPI Kristin Waller is a 66 y.o. female.   Patient presents with blister like or boil like rash for 1 week.  Symptoms initially started as left lower quadrant abdominal pain described as cramping that dissipated prior to the rash beginning.  Rashes not pruritic and nondraining.  Has attempted use of over-the-counter benzocaine which was not helpful.  Denies fever, chills, nausea, vomiting, diarrhea, heartburn or indigestion, urinary or bowel changes, vaginal changes.   History reviewed. No pertinent past medical history.  There are no problems to display for this patient.   Past Surgical History:  Procedure Laterality Date   NO PAST SURGERIES      OB History   No obstetric history on file.      Home Medications    Prior to Admission medications   Medication Sig Start Date End Date Taking? Authorizing Provider  Multiple Vitamins-Minerals (MULTIVITAMIN WITH MINERALS) tablet Take 1 tablet by mouth daily.    [provider]    Family History Family History  Problem Relation Age of Onset   Colon cancer Neg Hx    Colon polyps Neg Hx    Diabetes Neg Hx    Kidney disease Neg Hx    Gallbladder disease Neg Hx    Esophageal cancer Neg Hx     Social History Social History   Tobacco Use   Smoking status: Never   Smokeless tobacco: Never  Vaping Use   Vaping Use: Never used  Substance Use Topics   Alcohol use: No    Alcohol/week: 0.0 standard drinks   Drug use: No     Allergies   Patient has no known allergies.   Review of Systems Review of Systems  Gastrointestinal:  Positive for abdominal pain.    Physical Exam Triage Vital Signs ED Triage Vitals  Enc Vitals Group     BP 05/29/21 1013 130/84     Pulse Rate 05/29/21 1013 61     Resp 05/29/21 1013 18     Temp 05/29/21  1013 98.8 F (37.1 C)     Temp Source 05/29/21 1013 Oral     SpO2 05/29/21 1013 96 %     Weight --      Height --      Head Circumference --      Peak Flow --      Pain Score 05/29/21 1010 4     Pain Loc --      Pain Edu? --      Excl. in [place]? --    No data found.  Updated Vital Signs BP 130/84 (BP Location: Right Arm)    Pulse 61    Temp 98.8 F (37.1 C) (Oral)    Resp 18    SpO2 96%   Visual Acuity Right Eye Distance:   Left Eye Distance:   Bilateral Distance:    Right Eye Near:   Left Eye Near:    Bilateral Near:     Physical Exam Constitutional:      Appearance: Normal appearance.  Eyes:     Extraocular Movements: Extraocular movements intact.  Pulmonary:     Effort: Pulmonary effort is normal.  Abdominal:     General: Abdomen is flat.     Palpations: Abdomen is soft.  Tenderness: There is no abdominal tenderness.  Skin:    Comments: Blisterlike rash present on the left lumbar region extending into the left flank, crusting present, nondraining, nontender  Neurological:     Mental Status: She is alert and oriented to person, place, and time. Mental status is at baseline.  Psychiatric:        Mood and Affect: Mood normal.        Behavior: Behavior normal.     UC Treatments / Results  Labs (all labs ordered are listed, but only abnormal results are displayed) Labs Reviewed - No data to display  EKG   Radiology No results found.  Procedures Procedures (including critical care time)  Medications Ordered in UC Medications - No data to display  Initial Impression / Assessment and Plan / UC Course  I have reviewed the triage vital signs and the nursing notes.  Pertinent labs & imaging results that were available during my care of the patient were reviewed by me and considered in my medical decision making (see chart for details).  Herpes zoster without complication  Rash has been present for 1 week however will attempt use of valacyclovir for  7-day course, patient may take over-the-counter Tylenol or ibuprofen for pain, given written handout with more information about shingles, instructed patient once rash clears to follow-up with her primary care doctor to receive shingles vaccination, may follow-up with urgent care as needed Final Clinical Impressions(s) / UC Diagnoses   Final diagnoses:  None   Discharge Instructions   None    ED Prescriptions   None    PDMP not reviewed this encounter.   Hans Eden, NP 05/29/21 1044

## 2022-06-11 DIAGNOSIS — H524 Presbyopia: Secondary | ICD-10-CM | POA: Diagnosis not present

## 2022-06-24 DIAGNOSIS — H26493 Other secondary cataract, bilateral: Secondary | ICD-10-CM | POA: Diagnosis not present

## 2022-06-24 DIAGNOSIS — H26491 Other secondary cataract, right eye: Secondary | ICD-10-CM | POA: Diagnosis not present

## 2022-10-26 ENCOUNTER — Emergency Department (HOSPITAL_COMMUNITY): Payer: Medicare Other

## 2022-10-26 ENCOUNTER — Encounter (HOSPITAL_COMMUNITY): Payer: Self-pay

## 2022-10-26 ENCOUNTER — Emergency Department (HOSPITAL_COMMUNITY)
Admission: EM | Admit: 2022-10-26 | Discharge: 2022-10-26 | Disposition: A | Discharge status: Home / Self Care | Payer: Medicare Other | Attending: Emergency Medicine | Admitting: Emergency Medicine

## 2022-10-26 DIAGNOSIS — J984 Other disorders of lung: Secondary | ICD-10-CM | POA: Diagnosis not present

## 2022-10-26 DIAGNOSIS — K5790 Diverticulosis of intestine, part unspecified, without perforation or abscess without bleeding: Secondary | ICD-10-CM | POA: Diagnosis not present

## 2022-10-26 DIAGNOSIS — S2222XA Fracture of body of sternum, initial encounter for closed fracture: Secondary | ICD-10-CM | POA: Insufficient documentation

## 2022-10-26 DIAGNOSIS — K5900 Constipation, unspecified: Secondary | ICD-10-CM | POA: Insufficient documentation

## 2022-10-26 DIAGNOSIS — R109 Unspecified abdominal pain: Secondary | ICD-10-CM | POA: Diagnosis not present

## 2022-10-26 DIAGNOSIS — R918 Other nonspecific abnormal finding of lung field: Secondary | ICD-10-CM | POA: Diagnosis not present

## 2022-10-26 DIAGNOSIS — M25561 Pain in right knee: Secondary | ICD-10-CM | POA: Insufficient documentation

## 2022-10-26 DIAGNOSIS — Y92410 Unspecified street and highway as the place of occurrence of the external cause: Secondary | ICD-10-CM | POA: Insufficient documentation

## 2022-10-26 DIAGNOSIS — I700 Atherosclerosis of aorta: Secondary | ICD-10-CM | POA: Insufficient documentation

## 2022-10-26 DIAGNOSIS — M779 Enthesopathy, unspecified: Secondary | ICD-10-CM | POA: Diagnosis not present

## 2022-10-26 DIAGNOSIS — I771 Stricture of artery: Secondary | ICD-10-CM | POA: Diagnosis not present

## 2022-10-26 DIAGNOSIS — S299XXA Unspecified injury of thorax, initial encounter: Secondary | ICD-10-CM | POA: Diagnosis not present

## 2022-10-26 DIAGNOSIS — S8991XA Unspecified injury of right lower leg, initial encounter: Secondary | ICD-10-CM | POA: Diagnosis not present

## 2022-10-26 DIAGNOSIS — R079 Chest pain, unspecified: Secondary | ICD-10-CM | POA: Diagnosis not present

## 2022-10-26 LAB — I-STAT CHEM 8, ED
BUN: 12 mg/dL (ref 8–23)
Calcium, Ion: 1.2 mmol/L (ref 1.15–1.40)
Chloride: 103 mmol/L (ref 98–111)
Creatinine, Ser: 1 mg/dL (ref 0.44–1.00)
Glucose, Bld: 96 mg/dL (ref 70–99)
HCT: 38 % (ref 36.0–46.0)
Hemoglobin: 12.9 g/dL (ref 12.0–15.0)
Potassium: 3.6 mmol/L (ref 3.5–5.1)
Sodium: 139 mmol/L (ref 135–145)
TCO2: 25 mmol/L (ref 22–32)

## 2022-10-26 LAB — COMPREHENSIVE METABOLIC PANEL
ALT: 19 U/L (ref 0–44)
AST: 24 U/L (ref 15–41)
Albumin: 4.1 g/dL (ref 3.5–5.0)
Alkaline Phosphatase: 52 U/L (ref 38–126)
Anion gap: 9 (ref 5–15)
BUN: 15 mg/dL (ref 8–23)
CO2: 27 mmol/L (ref 22–32)
Calcium: 9.3 mg/dL (ref 8.9–10.3)
Chloride: 100 mmol/L (ref 98–111)
Creatinine, Ser: 1.04 mg/dL — ABNORMAL HIGH (ref 0.44–1.00)
GFR, Estimated: 59 mL/min — ABNORMAL LOW (ref >60)
Glucose, Bld: 101 mg/dL — ABNORMAL HIGH (ref 70–99)
Potassium: 3.5 mmol/L (ref 3.5–5.1)
Sodium: 136 mmol/L (ref 135–145)
Total Bilirubin: 0.6 mg/dL (ref 0.3–1.2)
Total Protein: 7.8 g/dL (ref 6.5–8.1)

## 2022-10-26 LAB — CBC WITH DIFFERENTIAL/PLATELET
Abs Immature Granulocytes: 0.04 10*3/uL (ref 0.00–0.07)
Basophils Absolute: 0 10*3/uL (ref 0.0–0.1)
Basophils Relative: 0 %
Eosinophils Absolute: 0 10*3/uL (ref 0.0–0.5)
Eosinophils Relative: 0 %
HCT: 43.9 % (ref 36.0–46.0)
Hemoglobin: 14.1 g/dL (ref 12.0–15.0)
Immature Granulocytes: 0 %
Lymphocytes Relative: 14 %
Lymphs Abs: 1.4 10*3/uL (ref 0.7–4.0)
MCH: 28.2 pg (ref 26.0–34.0)
MCHC: 32.1 g/dL (ref 30.0–36.0)
MCV: 87.8 fL (ref 80.0–100.0)
Monocytes Absolute: 0.5 10*3/uL (ref 0.1–1.0)
Monocytes Relative: 5 %
Neutro Abs: 7.8 10*3/uL — ABNORMAL HIGH (ref 1.7–7.7)
Neutrophils Relative %: 81 %
Platelets: 194 10*3/uL (ref 150–400)
RBC: 5 MIL/uL (ref 3.87–5.11)
RDW: 14.6 % (ref 11.5–15.5)
WBC: 9.7 10*3/uL (ref 4.0–10.5)
nRBC: 0 % (ref 0.0–0.2)

## 2022-10-26 MED ORDER — IOHEXOL 300 MG/ML  SOLN
100.0000 mL | Freq: Once | INTRAMUSCULAR | Status: AC | PRN
  Administered 2022-10-26: 100 mL via INTRAVENOUS

## 2022-10-26 MED ORDER — OXYCODONE-ACETAMINOPHEN 5-325 MG PO TABS: 1.0000 | ORAL_TABLET | Freq: Four times a day (QID) | ORAL | 0 refills | Status: DC | PRN

## 2022-10-26 MED ORDER — OXYCODONE-ACETAMINOPHEN 5-325 MG PO TABS
1.0000 | ORAL_TABLET | Freq: Once | ORAL | Status: AC
  Administered 2022-10-26: 1 via ORAL
  Filled 2022-10-26: qty 1

## 2022-10-26 MED ORDER — IOHEXOL 350 MG/ML SOLN: 100.0000 mL | Freq: Once | INTRAVENOUS | Status: DC | PRN

## 2022-10-26 MED ORDER — MORPHINE SULFATE (PF) 4 MG/ML IV SOLN
4.0000 mg | Freq: Once | INTRAVENOUS | Status: AC
  Administered 2022-10-26: 4 mg via INTRAVENOUS
  Filled 2022-10-26: qty 1

## 2022-10-26 MED ORDER — AZITHROMYCIN 250 MG PO TABS: 250.0000 mg | ORAL_TABLET | Freq: Every day | ORAL | 0 refills | Status: DC
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
## 2022-10-26 NOTE — ED Notes (Signed)
Assumed care of pt, found her alert and oriented w/ visitor bedside, assisting her in translating.  Updated pt to the delay, only one Xray is back.  Pt understanding.  Pt states no pain while still but when she moves or takes a deep breath it continues to hurt.

## 2022-11-13 DIAGNOSIS — E041 Nontoxic single thyroid nodule: Secondary | ICD-10-CM | POA: Diagnosis not present

## 2022-11-13 DIAGNOSIS — N289 Disorder of kidney and ureter, unspecified: Secondary | ICD-10-CM | POA: Diagnosis not present

## 2022-11-13 DIAGNOSIS — S2222XD Fracture of body of sternum, subsequent encounter for fracture with routine healing: Secondary | ICD-10-CM | POA: Diagnosis not present

## 2022-11-13 DIAGNOSIS — K869 Disease of pancreas, unspecified: Secondary | ICD-10-CM | POA: Diagnosis not present

## 2022-11-14 ENCOUNTER — Other Ambulatory Visit: Payer: Self-pay | Admitting: Family Medicine

## 2022-11-14 DIAGNOSIS — E041 Nontoxic single thyroid nodule: Secondary | ICD-10-CM

## 2022-11-15 ENCOUNTER — Other Ambulatory Visit: Payer: Self-pay | Admitting: Family Medicine

## 2022-11-15 DIAGNOSIS — K769 Liver disease, unspecified: Secondary | ICD-10-CM

## 2022-11-15 DIAGNOSIS — N289 Disorder of kidney and ureter, unspecified: Secondary | ICD-10-CM

## 2022-11-15 DIAGNOSIS — K869 Disease of pancreas, unspecified: Secondary | ICD-10-CM

## 2022-11-22 ENCOUNTER — Other Ambulatory Visit: Payer: Self-pay | Admitting: Family Medicine

## 2022-11-22 DIAGNOSIS — N289 Disorder of kidney and ureter, unspecified: Secondary | ICD-10-CM

## 2022-11-22 DIAGNOSIS — K769 Liver disease, unspecified: Secondary | ICD-10-CM

## 2022-11-22 DIAGNOSIS — K869 Disease of pancreas, unspecified: Secondary | ICD-10-CM

## 2022-12-02 ENCOUNTER — Ambulatory Visit
Admission: RE | Admit: 2022-12-02 | Discharge: 2022-12-02 | Disposition: A | Discharge status: Home / Self Care | Payer: Medicare Other | Source: Ambulatory Visit | Attending: Family Medicine | Admitting: Family Medicine

## 2022-12-02 DIAGNOSIS — E041 Nontoxic single thyroid nodule: Secondary | ICD-10-CM

## 2022-12-02 DIAGNOSIS — E042 Nontoxic multinodular goiter: Secondary | ICD-10-CM | POA: Diagnosis not present

## 2023-01-05 ENCOUNTER — Ambulatory Visit
Admission: RE | Admit: 2023-01-05 | Discharge: 2023-01-05 | Disposition: A | Discharge status: Home / Self Care | Payer: Medicare Other | Source: Ambulatory Visit | Attending: Family Medicine | Admitting: Family Medicine

## 2023-01-05 ENCOUNTER — Other Ambulatory Visit: Payer: Medicare Other

## 2023-01-05 DIAGNOSIS — K769 Liver disease, unspecified: Secondary | ICD-10-CM

## 2023-01-05 DIAGNOSIS — N289 Disorder of kidney and ureter, unspecified: Secondary | ICD-10-CM

## 2023-01-05 DIAGNOSIS — K869 Disease of pancreas, unspecified: Secondary | ICD-10-CM

## 2023-01-05 DIAGNOSIS — N281 Cyst of kidney, acquired: Secondary | ICD-10-CM | POA: Diagnosis not present

## 2023-01-05 DIAGNOSIS — K862 Cyst of pancreas: Secondary | ICD-10-CM | POA: Diagnosis not present

## 2023-01-05 MED ORDER — GADOPICLENOL 0.5 MMOL/ML IV SOLN
9.0000 mL | Freq: Once | INTRAVENOUS | Status: AC | PRN
  Administered 2023-01-05: 9 mL via INTRAVENOUS

## 2023-01-16 DIAGNOSIS — R93429 Abnormal radiologic findings on diagnostic imaging of unspecified kidney: Secondary | ICD-10-CM | POA: Diagnosis not present

## 2023-01-16 DIAGNOSIS — N289 Disorder of kidney and ureter, unspecified: Secondary | ICD-10-CM | POA: Diagnosis not present

## 2023-01-27 DIAGNOSIS — Z13220 Encounter for screening for lipoid disorders: Secondary | ICD-10-CM | POA: Diagnosis not present

## 2023-01-27 DIAGNOSIS — Z136 Encounter for screening for cardiovascular disorders: Secondary | ICD-10-CM | POA: Diagnosis not present

## 2023-01-27 DIAGNOSIS — Z0000 Encounter for general adult medical examination without abnormal findings: Secondary | ICD-10-CM | POA: Diagnosis not present

## 2023-01-27 DIAGNOSIS — N289 Disorder of kidney and ureter, unspecified: Secondary | ICD-10-CM | POA: Diagnosis not present

## 2023-01-27 DIAGNOSIS — Z131 Encounter for screening for diabetes mellitus: Secondary | ICD-10-CM | POA: Diagnosis not present

## 2023-01-27 DIAGNOSIS — I440 Atrioventricular block, first degree: Secondary | ICD-10-CM | POA: Diagnosis not present

## 2023-01-30 ENCOUNTER — Inpatient Hospital Stay: Payer: Medicare Other | Attending: Oncology | Admitting: Oncology

## 2023-01-30 ENCOUNTER — Telehealth: Payer: Self-pay | Admitting: *Deleted

## 2023-01-30 ENCOUNTER — Other Ambulatory Visit: Payer: Self-pay

## 2023-01-30 ENCOUNTER — Encounter: Payer: Self-pay | Admitting: Oncology

## 2023-01-30 ENCOUNTER — Inpatient Hospital Stay: Payer: Medicare Other

## 2023-01-30 VITALS — BP 152/65 | HR 50 | Temp 97.0°F | Resp 18 | Wt 173.8 lb

## 2023-01-30 DIAGNOSIS — N289 Disorder of kidney and ureter, unspecified: Secondary | ICD-10-CM

## 2023-01-30 DIAGNOSIS — R93421 Abnormal radiologic findings on diagnostic imaging of right kidney: Secondary | ICD-10-CM | POA: Diagnosis not present

## 2023-01-30 DIAGNOSIS — K769 Liver disease, unspecified: Secondary | ICD-10-CM | POA: Insufficient documentation

## 2023-01-30 DIAGNOSIS — E042 Nontoxic multinodular goiter: Secondary | ICD-10-CM | POA: Insufficient documentation

## 2023-01-30 DIAGNOSIS — K862 Cyst of pancreas: Secondary | ICD-10-CM

## 2023-01-30 DIAGNOSIS — Z0000 Encounter for general adult medical examination without abnormal findings: Secondary | ICD-10-CM | POA: Insufficient documentation

## 2023-01-30 LAB — COMPREHENSIVE METABOLIC PANEL
ALT: 13 U/L (ref 0–44)
AST: 16 U/L (ref 15–41)
Albumin: 4.3 g/dL (ref 3.5–5.0)
Alkaline Phosphatase: 53 U/L (ref 38–126)
Anion gap: 6 (ref 5–15)
BUN: 11 mg/dL (ref 8–23)
CO2: 29 mmol/L (ref 22–32)
Calcium: 9.7 mg/dL (ref 8.9–10.3)
Chloride: 104 mmol/L (ref 98–111)
Creatinine, Ser: 0.93 mg/dL (ref 0.44–1.00)
GFR, Estimated: >60 mL/min (ref >60)
Glucose, Bld: 96 mg/dL (ref 70–99)
Potassium: 4.4 mmol/L (ref 3.5–5.1)
Sodium: 139 mmol/L (ref 135–145)
Total Bilirubin: 0.5 mg/dL (ref 0.3–1.2)
Total Protein: 8 g/dL (ref 6.5–8.1)

## 2023-01-30 LAB — LACTATE DEHYDROGENASE: LDH: 152 U/L (ref 98–192)

## 2023-01-30 LAB — CBC WITH DIFFERENTIAL/PLATELET
Abs Immature Granulocytes: 0.01 10*3/uL (ref 0.00–0.07)
Basophils Absolute: 0 10*3/uL (ref 0.0–0.1)
Basophils Relative: 0 %
Eosinophils Absolute: 0.1 10*3/uL (ref 0.0–0.5)
Eosinophils Relative: 3 %
HCT: 44.6 % (ref 36.0–46.0)
Hemoglobin: 14.7 g/dL (ref 12.0–15.0)
Immature Granulocytes: 0 %
Lymphocytes Relative: 36 %
Lymphs Abs: 1.7 10*3/uL (ref 0.7–4.0)
MCH: 28.8 pg (ref 26.0–34.0)
MCHC: 33 g/dL (ref 30.0–36.0)
MCV: 87.5 fL (ref 80.0–100.0)
Monocytes Absolute: 0.3 10*3/uL (ref 0.1–1.0)
Monocytes Relative: 7 %
Neutro Abs: 2.5 10*3/uL (ref 1.7–7.7)
Neutrophils Relative %: 54 %
Platelets: 176 10*3/uL (ref 150–400)
RBC: 5.1 MIL/uL (ref 3.87–5.11)
RDW: 14.4 % (ref 11.5–15.5)
WBC: 4.7 10*3/uL (ref 4.0–10.5)
nRBC: 0 % (ref 0.0–0.2)

## 2023-01-30 NOTE — Telephone Encounter (Signed)
Referral, patient demographic information, today's office note & MRI report routed to [facility] for referral.

## 2023-01-30 NOTE — Assessment & Plan Note (Signed)
-  Encourage healthy diet, hydration, and avoidance of medications that can harm the kidneys. -Plan for mammogram and colonoscopy screening after surgery.
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
## 2023-01-30 NOTE — Assessment & Plan Note (Signed)
Benign cyst identified in the tail of the pancreas on MRI. No intervention required at this time. -Monitor during future scans for kidney.

## 2023-01-30 NOTE — Assessment & Plan Note (Signed)
Benign cyst identified in the liver on MRI. No intervention required at this time. -Monitor during future scans for kidney.

## 2023-01-30 NOTE — Assessment & Plan Note (Addendum)
-   Please review HPI above for additional details.  Two suspicious lesions on the right kidney identified on MRI, concerning for malignancy. No evidence of metastatic disease on CT scan and MRI. Benign cysts noted in the liver and pancreas, not requiring intervention. -Today I discussed imaging findings with patient and her husband and her daughter.  I also showed them MRI images.  Picture is concerning for possible renal malignancy. - Discussed different ways of intervention including partial versus total right nephrectomy versus ablative measures. - Referred her to urology for further evaluation and management.  -Routine labs including CBC, CMP, LDH were all unremarkable today. -Clinically appears to be early stage kidney cancer.  If confirmed to be the same after surgery, no adjuvant treatment would be indicated based on current findings.  Patient was provided reassurance. - Plan for post-surgical follow-up to determine staging and long-term monitoring plan.  Will schedule a tentative appointment in approximately 8 weeks for follow-up.

## 2023-01-31 LAB — AFP TUMOR MARKER: AFP, Serum, Tumor Marker: 2.1 ng/mL (ref 0.0–9.2)

## 2023-01-31 LAB — CANCER ANTIGEN 19-9: CA 19-9: 13 U/mL (ref 0–35)

## 2023-02-03 DIAGNOSIS — D4101 Neoplasm of uncertain behavior of right kidney: Secondary | ICD-10-CM | POA: Diagnosis not present

## 2023-02-07 ENCOUNTER — Other Ambulatory Visit: Payer: Self-pay | Admitting: Urology

## 2023-02-11 NOTE — Progress Notes (Signed)
Please place orders for PAT appointment scheduled 02/12/23.

## 2023-02-11 NOTE — Progress Notes (Signed)
COVID Vaccine Completed:  Date of COVID positive in last 90 days:  PCP - Shireen Quan, DO Cardiologist -   Chest x-ray - CT 10/26/22 in Epic  EKG -  Stress Test -  ECHO -  Cardiac Cath -  Pacemaker/ICD device last checked: Spinal Cord Stimulator:  Bowel Prep -   Sleep Study -  CPAP -   Fasting Blood Sugar -  Checks Blood Sugar _____ times a day  Last dose of GLP1 agonist-  N/A GLP1 instructions:  N/A   Last dose of SGLT-2 inhibitors-  N/A SGLT-2 instructions: N/A   Blood Thinner Instructions:  Time Aspirin Instructions: Last Dose:  Activity level:  Can go up a flight of stairs and perform activities of daily living without stopping and without symptoms of chest pain or shortness of breath.  Able to exercise without symptoms  Unable to go up a flight of stairs without symptoms of     Anesthesia review:   Patient denies shortness of breath, fever, cough and chest pain at PAT appointment  Patient verbalized understanding of instructions that were given to them at the PAT appointment. Patient was also instructed that they will need to review over the PAT instructions again at home before surgery.

## 2023-02-11 NOTE — Patient Instructions (Signed)
SURGICAL WAITING ROOM VISITATION  Patients having surgery or a procedure may have no more than 2 support people in the waiting area - these visitors may rotate.    Children under the age of 43 must have an adult with them who is not the patient.  Due to an increase in RSV and influenza rates and associated hospitalizations, children ages 37 and under may not visit patients in [facility] hospitals.  If the patient needs to stay at the hospital during part of their recovery, the visitor guidelines for inpatient rooms apply. Pre-op nurse will coordinate an appropriate time for 1 support person to accompany patient in pre-op.  This support person may not rotate.    Please refer to the [facility] website for the visitor guidelines for Inpatients (after your surgery is over and you are in a regular room).    Your procedure is scheduled on: 02/14/23   Report to [facility] Main Entrance    Report to admitting at 10:15 AM   Call this number if you have problems the morning of surgery [phone]   Follow a clear liquid diet the day before (10/31)   Nothing to drink after midnight  Water Non-Citrus Juices (without pulp, NO RED-Apple, White grape, White cranberry) Black Coffee (NO MILK/CREAM OR CREAMERS, sugar ok)  Clear Tea (NO MILK/CREAM OR CREAMERS, sugar ok) regular and decaf                             Plain Jell-O (NO RED)                                           Fruit ices (not with fruit pulp, NO RED)                                     Popsicles (NO RED)                                                               Sports drinks like Gatorade (NO RED)          If you have questions, please contact your surgeon's office.   FOLLOW BOWEL PREP AND ANY ADDITIONAL PRE OP INSTRUCTIONS YOU RECEIVED FROM YOUR SURGEON'S OFFICE!!!     Oral Hygiene is also important to reduce your risk of infection.                                    Remember - BRUSH YOUR TEETH THE MORNING OF  SURGERY WITH YOUR REGULAR TOOTHPASTE  DENTURES WILL BE REMOVED PRIOR TO SURGERY PLEASE DO NOT APPLY "Poly grip" OR ADHESIVES!!!   Stop all vitamins and herbal supplements 7 days before surgery.   Take these medicines the morning of surgery with A SIP OF WATER: None                               You may  not have any metal on your body including hair pins, jewelry, and body piercing             Do not wear make-up, lotions, powders, perfumes, or deodorant  Do not wear nail polish including gel and S&S, artificial/acrylic nails, or any other type of covering on natural nails including finger and toenails. If you have artificial nails, gel coating, etc. that needs to be removed by a nail salon please have this removed prior to surgery or surgery may need to be canceled/ delayed if the surgeon/ anesthesia feels like they are unable to be safely monitored.   Do not shave  48 hours prior to surgery.    Do not bring valuables to the hospital. [facility] IS NOT             RESPONSIBLE   FOR VALUABLES.   Contacts, glasses, dentures or bridgework may not be worn into surgery.   Bring small overnight bag day of surgery.   DO NOT BRING YOUR HOME MEDICATIONS TO THE HOSPITAL. PHARMACY WILL DISPENSE MEDICATIONS LISTED ON YOUR MEDICATION LIST TO YOU DURING YOUR ADMISSION IN THE HOSPITAL!              Please read over the following fact sheets you were given: IF YOU HAVE QUESTIONS ABOUT YOUR PRE-OP INSTRUCTIONS PLEASE CALL [phone] Contras    If you received a COVID test during your pre-op visit  it is requested that you wear a mask when out in public, stay away from anyone that may not be feeling well and notify your surgeon if you develop symptoms. If you test positive for Covid or have been in contact with anyone that has tested positive in the last 10 days please notify you surgeon.    [facility] - Preparing for Surgery Before surgery, you can play an important role.  Because skin is not  sterile, your skin needs to be as free of germs as possible.  You can reduce the number of germs on your skin by washing with CHG (chlorahexidine gluconate) soap before surgery.  CHG is an antiseptic cleaner which kills germs and bonds with the skin to continue killing germs even after washing. Please DO NOT use if you have an allergy to CHG or antibacterial soaps.  If your skin becomes reddened/irritated stop using the CHG and inform your nurse when you arrive at Short Stay. Do not shave (including legs and underarms) for at least 48 hours prior to the first CHG shower.  You may shave your face/neck.  Please follow these instructions carefully:  1.  Shower with CHG Soap the night before surgery and the  morning of surgery.  2.  If you choose to wash your hair, wash your hair first as usual with your normal  shampoo.  3.  After you shampoo, rinse your hair and body thoroughly to remove the shampoo.                             4.  Use CHG as you would any other liquid soap.  You can apply chg directly to the skin and wash.  Gently with a scrungie or clean washcloth.  5.  Apply the CHG Soap to your body ONLY FROM THE NECK DOWN.   Do   not use on face/ open  Wound or open sores. Avoid contact with eyes, ears mouth and   genitals (private parts).                       Wash face,  Genitals (private parts) with your normal soap.             6.  Wash thoroughly, paying special attention to the area where your    surgery  will be performed.  7.  Thoroughly rinse your body with warm water from the neck down.  8.  DO NOT shower/wash with your normal soap after using and rinsing off the CHG Soap.                9.  Pat yourself dry with a clean towel.            10.  Wear clean pajamas.            11.  Place clean sheets on your bed the night of your first shower and do not  sleep with pets. Day of Surgery : Do not apply any lotions/deodorants the morning of surgery.  Please wear  clean clothes to the hospital/surgery center.  FAILURE TO FOLLOW THESE INSTRUCTIONS MAY RESULT IN THE CANCELLATION OF YOUR SURGERY  PATIENT SIGNATURE_________________________________  NURSE SIGNATURE__________________________________  ________________________________________________________________________

## 2023-02-12 ENCOUNTER — Other Ambulatory Visit: Payer: Self-pay

## 2023-02-12 ENCOUNTER — Encounter (HOSPITAL_COMMUNITY): Payer: Self-pay

## 2023-02-12 ENCOUNTER — Encounter (HOSPITAL_COMMUNITY)
Admission: RE | Admit: 2023-02-12 | Discharge: 2023-02-12 | Disposition: A | Discharge status: Home / Self Care | Payer: Medicare Other | Source: Ambulatory Visit | Attending: Urology | Admitting: Urology

## 2023-02-12 VITALS — BP 130/71 | HR 58 | Temp 98.4°F | Resp 16 | Ht 60.24 in | Wt 173.0 lb

## 2023-02-12 DIAGNOSIS — Z01812 Encounter for preprocedural laboratory examination: Secondary | ICD-10-CM | POA: Diagnosis not present

## 2023-02-12 DIAGNOSIS — Z01818 Encounter for other preprocedural examination: Secondary | ICD-10-CM

## 2023-02-12 DIAGNOSIS — R030 Elevated blood-pressure reading, without diagnosis of hypertension: Secondary | ICD-10-CM | POA: Insufficient documentation

## 2023-02-12 LAB — CBC
HCT: 39.6 % (ref 36.0–46.0)
Hemoglobin: 12.8 g/dL (ref 12.0–15.0)
MCH: 29 pg (ref 26.0–34.0)
MCHC: 32.3 g/dL (ref 30.0–36.0)
MCV: 89.6 fL (ref 80.0–100.0)
Platelets: 177 10*3/uL (ref 150–400)
RBC: 4.42 MIL/uL (ref 3.87–5.11)
RDW: 14.5 % (ref 11.5–15.5)
WBC: 4.9 10*3/uL (ref 4.0–10.5)
nRBC: 0 % (ref 0.0–0.2)

## 2023-02-14 ENCOUNTER — Observation Stay (HOSPITAL_COMMUNITY)
Admission: RE | Admit: 2023-02-14 | Discharge: 2023-02-15 | Disposition: A | Discharge status: Home / Self Care | Payer: Medicare Other | Attending: Urology | Admitting: Urology

## 2023-02-14 ENCOUNTER — Encounter (HOSPITAL_COMMUNITY): Payer: Self-pay | Admitting: Urology

## 2023-02-14 ENCOUNTER — Ambulatory Visit (HOSPITAL_COMMUNITY): Payer: Medicare Other | Admitting: Anesthesiology

## 2023-02-14 ENCOUNTER — Encounter (HOSPITAL_COMMUNITY)
Admission: RE | Disposition: A | Discharge status: Home / Self Care | Payer: Self-pay | Source: Home / Self Care | Attending: Urology

## 2023-02-14 ENCOUNTER — Other Ambulatory Visit: Payer: Self-pay

## 2023-02-14 ENCOUNTER — Ambulatory Visit (HOSPITAL_BASED_OUTPATIENT_CLINIC_OR_DEPARTMENT_OTHER): Payer: Medicare Other | Admitting: Anesthesiology

## 2023-02-14 DIAGNOSIS — D49511 Neoplasm of unspecified behavior of right kidney: Secondary | ICD-10-CM | POA: Diagnosis not present

## 2023-02-14 DIAGNOSIS — N2889 Other specified disorders of kidney and ureter: Secondary | ICD-10-CM

## 2023-02-14 DIAGNOSIS — Z0000 Encounter for general adult medical examination without abnormal findings: Principal | ICD-10-CM

## 2023-02-14 DIAGNOSIS — D1771 Benign lipomatous neoplasm of kidney: Secondary | ICD-10-CM | POA: Diagnosis not present

## 2023-02-14 HISTORY — PX: ROBOTIC ASSITED PARTIAL NEPHRECTOMY: SHX6087

## 2023-02-14 LAB — HEMOGLOBIN AND HEMATOCRIT, BLOOD
HCT: 41.2 % (ref 36.0–46.0)
Hemoglobin: 13.4 g/dL (ref 12.0–15.0)

## 2023-02-14 LAB — TYPE AND SCREEN
ABO/RH(D): A POS
Antibody Screen: NEGATIVE

## 2023-02-14 LAB — ABO/RH: ABO/RH(D): A POS

## 2023-02-14 SURGERY — NEPHRECTOMY, PARTIAL, ROBOT-ASSISTED
Anesthesia: General | Laterality: Right

## 2023-02-14 MED ORDER — ROCURONIUM BROMIDE 10 MG/ML (PF) SYRINGE
PREFILLED_SYRINGE | INTRAVENOUS | Status: DC | PRN
  Administered 2023-02-14: 10 mg via INTRAVENOUS
  Administered 2023-02-14: 60 mg via INTRAVENOUS
  Administered 2023-02-14: 20 mg via INTRAVENOUS

## 2023-02-14 MED ORDER — EPHEDRINE 5 MG/ML INJ
INTRAVENOUS | Status: AC
  Filled 2023-02-14: qty 5

## 2023-02-14 MED ORDER — DOCUSATE SODIUM 100 MG PO CAPS
100.0000 mg | ORAL_CAPSULE | Freq: Two times a day (BID) | ORAL | Status: DC
  Administered 2023-02-14 – 2023-02-15 (×2): 100 mg via ORAL
  Filled 2023-02-14 (×2): qty 1

## 2023-02-14 MED ORDER — ONDANSETRON HCL 4 MG/2ML IJ SOLN
INTRAMUSCULAR | Status: DC | PRN
  Administered 2023-02-14: 4 mg via INTRAVENOUS

## 2023-02-14 MED ORDER — OXYCODONE HCL 5 MG PO TABS
5.0000 mg | ORAL_TABLET | ORAL | Status: DC | PRN
  Administered 2023-02-15: 5 mg via ORAL
  Filled 2023-02-14: qty 1

## 2023-02-14 MED ORDER — DOCUSATE SODIUM 100 MG PO CAPS: 100.0000 mg | ORAL_CAPSULE | Freq: Every day | ORAL | 0 refills | Status: AC | PRN

## 2023-02-14 MED ORDER — STERILE WATER FOR IRRIGATION IR SOLN
Status: DC | PRN
  Administered 2023-02-14: 1000 mL

## 2023-02-14 MED ORDER — CEFAZOLIN SODIUM-DEXTROSE 2-4 GM/100ML-% IV SOLN
2.0000 g | INTRAVENOUS | Status: AC
  Administered 2023-02-14: 2 g via INTRAVENOUS
  Filled 2023-02-14: qty 100

## 2023-02-14 MED ORDER — HYDROMORPHONE HCL 1 MG/ML IJ SOLN
INTRAMUSCULAR | Status: AC
  Administered 2023-02-14: 0.5 mg via INTRAVENOUS
  Filled 2023-02-14: qty 1

## 2023-02-14 MED ORDER — DROPERIDOL 2.5 MG/ML IJ SOLN
INTRAMUSCULAR | Status: AC
  Administered 2023-02-14: 0.625 mg via INTRAVENOUS
  Filled 2023-02-14: qty 2

## 2023-02-14 MED ORDER — SUGAMMADEX SODIUM 200 MG/2ML IV SOLN
INTRAVENOUS | Status: DC | PRN
  Administered 2023-02-14: 200 mg via INTRAVENOUS

## 2023-02-14 MED ORDER — HYDROMORPHONE HCL 1 MG/ML IJ SOLN: 0.5000 mg | INTRAMUSCULAR | Status: DC | PRN

## 2023-02-14 MED ORDER — DEXAMETHASONE SODIUM PHOSPHATE 10 MG/ML IJ SOLN
INTRAMUSCULAR | Status: DC | PRN
  Administered 2023-02-14: 5 mg via INTRAVENOUS

## 2023-02-14 MED ORDER — DEXAMETHASONE SODIUM PHOSPHATE 10 MG/ML IJ SOLN
INTRAMUSCULAR | Status: AC
  Filled 2023-02-14: qty 1

## 2023-02-14 MED ORDER — HYDROMORPHONE HCL 1 MG/ML IJ SOLN
0.2500 mg | INTRAMUSCULAR | Status: DC | PRN
  Administered 2023-02-14: 0.5 mg via INTRAVENOUS

## 2023-02-14 MED ORDER — DROPERIDOL 2.5 MG/ML IJ SOLN: 0.6250 mg | Freq: Once | INTRAMUSCULAR | Status: AC | PRN

## 2023-02-14 MED ORDER — FENTANYL CITRATE (PF) 250 MCG/5ML IJ SOLN
INTRAMUSCULAR | Status: DC | PRN
  Administered 2023-02-14 (×3): 50 ug via INTRAVENOUS
  Administered 2023-02-14: 100 ug via INTRAVENOUS

## 2023-02-14 MED ORDER — ALBUMIN HUMAN 5 % IV SOLN: INTRAVENOUS | Status: DC | PRN

## 2023-02-14 MED ORDER — ACETAMINOPHEN 10 MG/ML IV SOLN: 1000.0000 mg | Freq: Once | INTRAVENOUS | Status: DC | PRN

## 2023-02-14 MED ORDER — MIDAZOLAM HCL 2 MG/2ML IJ SOLN
INTRAMUSCULAR | Status: DC | PRN
  Administered 2023-02-14: 2 mg via INTRAVENOUS

## 2023-02-14 MED ORDER — ONDANSETRON HCL 4 MG/2ML IJ SOLN: 4.0000 mg | INTRAMUSCULAR | Status: DC | PRN

## 2023-02-14 MED ORDER — FENTANYL CITRATE (PF) 250 MCG/5ML IJ SOLN
INTRAMUSCULAR | Status: AC
  Filled 2023-02-14: qty 5

## 2023-02-14 MED ORDER — LACTATED RINGERS IV SOLN: INTRAVENOUS | Status: DC

## 2023-02-14 MED ORDER — MAGNESIUM CITRATE PO SOLN: 1.0000 | Freq: Once | ORAL | Status: DC

## 2023-02-14 MED ORDER — ALBUMIN HUMAN 5 % IV SOLN
INTRAVENOUS | Status: AC
  Filled 2023-02-14: qty 250

## 2023-02-14 MED ORDER — ONDANSETRON HCL 4 MG/2ML IJ SOLN
INTRAMUSCULAR | Status: AC
Start: 2023-02-14 — End: ?
  Filled 2023-02-14: qty 2

## 2023-02-14 MED ORDER — SODIUM CHLORIDE (PF) 0.9 % IJ SOLN
INTRAMUSCULAR | Status: AC
  Filled 2023-02-14: qty 20

## 2023-02-14 MED ORDER — BUPIVACAINE LIPOSOME 1.3 % IJ SUSP
INTRAMUSCULAR | Status: AC
  Filled 2023-02-14: qty 20

## 2023-02-14 MED ORDER — ROCURONIUM BROMIDE 10 MG/ML (PF) SYRINGE
PREFILLED_SYRINGE | INTRAVENOUS | Status: AC
  Filled 2023-02-14: qty 10

## 2023-02-14 MED ORDER — ACETAMINOPHEN 160 MG/5ML PO SOLN
325.0000 mg | Freq: Once | ORAL | Status: DC | PRN
Start: 2023-02-14 — End: 2023-02-14

## 2023-02-14 MED ORDER — ACETAMINOPHEN 325 MG PO TABS
325.0000 mg | ORAL_TABLET | Freq: Once | ORAL | Status: DC | PRN
Start: 2023-02-14 — End: 2023-02-14

## 2023-02-14 MED ORDER — OXYCODONE HCL 5 MG PO TABS: 5.0000 mg | ORAL_TABLET | Freq: Four times a day (QID) | ORAL | 0 refills | Status: AC | PRN

## 2023-02-14 MED ORDER — POTASSIUM CHLORIDE IN NACL 20-0.9 MEQ/L-% IV SOLN
INTRAVENOUS | Status: DC
  Filled 2023-02-14 (×2): qty 1000

## 2023-02-14 MED ORDER — MIDAZOLAM HCL 2 MG/2ML IJ SOLN
INTRAMUSCULAR | Status: AC
  Filled 2023-02-14: qty 2

## 2023-02-14 MED ORDER — ACETAMINOPHEN 500 MG PO TABS: 1000.0000 mg | ORAL_TABLET | Freq: Four times a day (QID) | ORAL | 0 refills | Status: AC

## 2023-02-14 MED ORDER — ACETAMINOPHEN 500 MG PO TABS
1000.0000 mg | ORAL_TABLET | Freq: Four times a day (QID) | ORAL | Status: DC
  Administered 2023-02-14 – 2023-02-15 (×3): 1000 mg via ORAL
  Filled 2023-02-14 (×3): qty 2

## 2023-02-14 MED ORDER — SODIUM CHLORIDE 0.9 % IV SOLN
INTRAVENOUS | Status: DC | PRN
  Administered 2023-02-14: 40 mL

## 2023-02-14 MED ORDER — PROPOFOL 10 MG/ML IV BOLUS
INTRAVENOUS | Status: AC
  Filled 2023-02-14: qty 20

## 2023-02-14 MED ORDER — CHLORHEXIDINE GLUCONATE 0.12 % MT SOLN
15.0000 mL | Freq: Once | OROMUCOSAL | Status: AC
Start: 2023-02-14 — End: 2023-02-14
  Administered 2023-02-14: 15 mL via OROMUCOSAL

## 2023-02-14 MED ORDER — LIDOCAINE 2% (20 MG/ML) 5 ML SYRINGE
INTRAMUSCULAR | Status: DC | PRN
  Administered 2023-02-14: 40 mg via INTRAVENOUS

## 2023-02-14 MED ORDER — LACTATED RINGERS IR SOLN
Status: DC | PRN
  Administered 2023-02-14: 1000 mL

## 2023-02-14 MED ORDER — ORAL CARE MOUTH RINSE: 15.0000 mL | Freq: Once | OROMUCOSAL | Status: AC

## 2023-02-14 MED ORDER — ACETAMINOPHEN 10 MG/ML IV SOLN
INTRAVENOUS | Status: AC
  Administered 2023-02-14: 1000 mg via INTRAVENOUS
  Filled 2023-02-14: qty 100

## 2023-02-14 MED ORDER — LIDOCAINE HCL (PF) 2 % IJ SOLN
INTRAMUSCULAR | Status: AC
  Filled 2023-02-14: qty 5

## 2023-02-14 MED ORDER — PROPOFOL 10 MG/ML IV BOLUS
INTRAVENOUS | Status: DC | PRN
  Administered 2023-02-14: 130 mg via INTRAVENOUS

## 2023-02-14 MED ORDER — EPHEDRINE SULFATE-NACL 50-0.9 MG/10ML-% IV SOSY
PREFILLED_SYRINGE | INTRAVENOUS | Status: DC | PRN
  Administered 2023-02-14: 5 mg via INTRAVENOUS

## 2023-02-14 SURGICAL SUPPLY — 82 items
ADH SKN CLS APL DERMABOND .7 (GAUZE/BANDAGES/DRESSINGS) ×1
AGENT HMST KT MTR STRL THRMB (HEMOSTASIS) ×1
APL ESCP 34 STRL LF DISP (HEMOSTASIS) ×1
APL PRP STRL LF DISP 70% ISPRP (MISCELLANEOUS) ×1
APPLICATOR SURGIFLO ENDO (HEMOSTASIS) ×1 IMPLANT
BAG COUNTER SPONGE SURGICOUNT (BAG) IMPLANT
BAG SPNG CNTER NS LX DISP (BAG)
CHLORAPREP W/TINT 26 (MISCELLANEOUS) ×1 IMPLANT
CLIP LIGATING HEM O LOK PURPLE (MISCELLANEOUS) ×2 IMPLANT
CLIP LIGATING HEMO LOK XL GOLD (MISCELLANEOUS) IMPLANT
CLIP LIGATING HEMO O LOK GREEN (MISCELLANEOUS) ×1 IMPLANT
CLIP SUT LAPRA TY ABSORB (SUTURE) ×1 IMPLANT
COVER SURGICAL LIGHT HANDLE (MISCELLANEOUS) ×1 IMPLANT
COVER TIP SHEARS 8 DVNC (MISCELLANEOUS) ×1 IMPLANT
CUTTER ECHEON FLEX ENDO 45 340 (ENDOMECHANICALS) IMPLANT
DERMABOND ADVANCED .7 DNX12 (GAUZE/BANDAGES/DRESSINGS) ×1 IMPLANT
DRAIN CHANNEL 15F RND FF 3/16 (WOUND CARE) ×1 IMPLANT
DRAPE ARM DVNC X/XI (DISPOSABLE) ×4 IMPLANT
DRAPE COLUMN DVNC XI (DISPOSABLE) ×1 IMPLANT
DRAPE INCISE IOBAN 66X45 STRL (DRAPES) ×1 IMPLANT
DRAPE SHEET LG 3/4 BI-LAMINATE (DRAPES) ×1 IMPLANT
DRIVER NDL LRG 8 DVNC XI (INSTRUMENTS) ×2 IMPLANT
DRIVER NDLE LRG 8 DVNC XI (INSTRUMENTS) ×2
DRSG TEGADERM 4X4.75 (GAUZE/BANDAGES/DRESSINGS) ×1 IMPLANT
ELECT PENCIL ROCKER SW 15FT (MISCELLANEOUS) ×1 IMPLANT
ELECT REM PT RETURN 15FT ADLT (MISCELLANEOUS) ×1 IMPLANT
EVACUATOR SILICONE 100CC (DRAIN) ×1 IMPLANT
FORCEPS BPLR FENES DVNC XI (FORCEP) ×1 IMPLANT
FORCEPS PROGRASP DVNC XI (FORCEP) ×1 IMPLANT
GAUZE 4X4 16PLY ~~LOC~~+RFID DBL (SPONGE) ×1 IMPLANT
GAUZE SPONGE 2X2 8PLY STRL LF (GAUZE/BANDAGES/DRESSINGS) ×1 IMPLANT
GLOVE BIO SURGEON STRL SZ 6.5 (GLOVE) ×1 IMPLANT
GLOVE SURG LX STRL 7.5 STRW (GLOVE) ×2 IMPLANT
GOWN STRL REUS W/ TWL XL LVL3 (GOWN DISPOSABLE) ×2 IMPLANT
GOWN STRL REUS W/TWL XL LVL3 (GOWN DISPOSABLE) ×2
GOWN STRL SURGICAL XL XLNG (GOWN DISPOSABLE) ×1 IMPLANT
HEMOSTAT SURGICEL 4X8 (HEMOSTASIS) ×1 IMPLANT
HOLDER FOLEY CATH W/STRAP (MISCELLANEOUS) ×1 IMPLANT
IRRIG SUCT STRYKERFLOW 2 WTIP (MISCELLANEOUS) ×1
IRRIGATION SUCT STRKRFLW 2 WTP (MISCELLANEOUS) ×1 IMPLANT
KIT BASIN OR (CUSTOM PROCEDURE TRAY) ×1 IMPLANT
KIT TURNOVER KIT A (KITS) IMPLANT
LOOP VESSEL MAXI BLUE (MISCELLANEOUS) ×1 IMPLANT
MARKER SKIN DUAL TIP RULER LAB (MISCELLANEOUS) ×1 IMPLANT
NDL HYPO 22X1.5 SAFETY MO (MISCELLANEOUS) ×1 IMPLANT
NDL INSUFFLATION 14GA 120MM (NEEDLE) ×1 IMPLANT
NEEDLE HYPO 22X1.5 SAFETY MO (MISCELLANEOUS) ×1
NEEDLE INSUFFLATION 14GA 120MM (NEEDLE) ×1
PORT ACCESS TROCAR AIRSEAL 12 (TROCAR) ×1 IMPLANT
PROTECTOR NERVE ULNAR (MISCELLANEOUS) ×2 IMPLANT
RELOAD STAPLE 45 2.6 WHT THIN (STAPLE) IMPLANT
SCISSORS LAP 5X45 EPIX DISP (ENDOMECHANICALS) IMPLANT
SCISSORS MNPLR CVD DVNC XI (INSTRUMENTS) ×1 IMPLANT
SEAL UNIV 5-12 XI (MISCELLANEOUS) ×4 IMPLANT
SET TRI-LUMEN FLTR TB AIRSEAL (TUBING) ×1 IMPLANT
SOL ELECTROSURG ANTI STICK (MISCELLANEOUS) ×1
SOLUTION ELECTROSURG ANTI STCK (MISCELLANEOUS) ×1 IMPLANT
SPIKE FLUID TRANSFER (MISCELLANEOUS) ×1 IMPLANT
SPONGE T-LAP 4X18 ~~LOC~~+RFID (SPONGE) ×1 IMPLANT
STAPLE RELOAD 45 WHT (STAPLE)
SURGIFLO W/THROMBIN 8M KIT (HEMOSTASIS) ×1 IMPLANT
SUT ETHILON 3 0 PS 1 (SUTURE) ×1 IMPLANT
SUT MNCRL AB 4-0 PS2 18 (SUTURE) ×2 IMPLANT
SUT PDS AB 1 CT1 27 (SUTURE) ×2 IMPLANT
SUT V-LOC BARB 180 2/0GR6 GS22 (SUTURE)
SUT VIC AB 0 CT1 27 (SUTURE) ×5
SUT VIC AB 0 CT1 27XBRD ANTBC (SUTURE) ×4 IMPLANT
SUT VIC AB 2-0 SH 27 (SUTURE) ×2
SUT VIC AB 2-0 SH 27X BRD (SUTURE) ×2 IMPLANT
SUT VIC AB 4-0 RB1 27 (SUTURE) ×1
SUT VIC AB 4-0 RB1 27XBRD (SUTURE) IMPLANT
SUT VLOC BARB 180 ABS3/0GR12 (SUTURE) ×2
SUTURE V-LC BRB 180 2/0GR6GS22 (SUTURE) IMPLANT
SUTURE VLOC BRB 180 ABS3/0GR12 (SUTURE) ×1 IMPLANT
SYS BAG RETRIEVAL 10MM (BASKET) ×2
SYSTEM BAG RETRIEVAL 10MM (BASKET) ×1 IMPLANT
TOWEL OR 17X26 10 PK STRL BLUE (TOWEL DISPOSABLE) ×1 IMPLANT
TRAY FOLEY MTR SLVR 16FR STAT (SET/KITS/TRAYS/PACK) ×1 IMPLANT
TRAY LAPAROSCOPIC (CUSTOM PROCEDURE TRAY) ×1 IMPLANT
TROCAR Z THREAD OPTICAL 12X100 (TROCAR) ×1 IMPLANT
TROCAR Z-THREAD OPTICAL 5X100M (TROCAR) IMPLANT
WATER STERILE IRR 1000ML POUR (IV SOLUTION) ×2 IMPLANT

## 2023-02-14 NOTE — Discharge Summary (Signed)
Date of admission: 02/14/2023  Date of discharge: 02/15/2023  Admission diagnosis:  Renal mass, right [N28.89]   Discharge diagnosis:  Renal mass, right [N28.89]  Secondary diagnoses:   Active Ambulatory Problems    Diagnosis Date Noted   Kidney lesion, native, right 01/30/2023   Pancreatic cyst 01/30/2023   Liver lesion, right lobe 01/30/2023   Healthcare maintenance 01/30/2023   Resolved Ambulatory Problems    Diagnosis Date Noted   No Resolved Ambulatory Problems   No Additional Past Medical History     History and Physical: For full details, please see admission history and physical. Briefly, Kristin Waller is a 67 y.o. year old patient who was admitted with 2 right renal masses.   Hospital Course: Pt admitted and underwent right partial nephrectomy on 02/14/2023. Their hospital course was unremarkable. By POD1, they were tolerating a regular diet, voiding spontaneously, pain was controlled with oral medications, and they were deemed appropriate for discharge.   Their course was complicated by: None  On the day of discharge, the patient was tolerating a regular diet and their pain was well controlled. They were determined to be stable for discharge home. JP drain was removed prior to discharge.   Laboratory values:  Recent Labs    02/12/23 1433 02/14/23 1703 02/15/23 0359  HGB 12.8 13.4 11.9*  HCT 39.6 41.2 36.8   Recent Labs    02/15/23 0359  CREATININE 1.04*    Disposition: Home  Discharge medications:  Allergies as of 02/15/2023   No Known Allergies      Medication List     STOP taking these medications    azithromycin 250 MG tablet Commonly known as: ZITHROMAX   oxyCODONE-acetaminophen 5-325 MG tablet Commonly known as: PERCOCET/ROXICET       TAKE these medications    acetaminophen 500 MG tablet Commonly known as: TYLENOL Take 2 tablets (1,000 mg total) by mouth every 6 (six) hours for 7 days.   docusate sodium 100 MG capsule Commonly  known as: Colace Take 1 capsule (100 mg total) by mouth daily as needed for up to 7 days.   multivitamin with minerals tablet Take 1 tablet by mouth daily.   oxyCODONE 5 MG immediate release tablet Commonly known as: Roxicodone Take 1 tablet (5 mg total) by mouth every 6 (six) hours as needed for up to 7 days for moderate pain (pain score 4-6) or severe pain (pain score 7-10) (post-operatively).        Followup:   Follow-up Information     Berneice Heinrich Delbert Phenix., MD Follow up on 03/04/2023.   Specialty: Urology Why: at 9:15 for MD visit and pathology review. Contact information: [address] [place] [address] [phone]

## 2023-02-14 NOTE — Brief Op Note (Signed)
02/14/2023  3:56 PM  PATIENT:  Kristin Waller  67 y.o. female  PRE-OPERATIVE DIAGNOSIS:  RIGHT RENAL MASS  POST-OPERATIVE DIAGNOSIS:  RIGHT RENAL MASS  PROCEDURE:  Procedure(s) with comments: XI ROBOTIC ASSITED PARTIAL NEPHRECTOMY (Right) - 180 MINUTES  SURGEON:  Surgeons and Role:    * Dax Murguia, Delbert Phenix., MD - Primary  PHYSICIAN ASSISTANT:   ASSISTANTS: Michaelene Song MD   ANESTHESIA:   local and general  EBL:  150 mL   BLOOD ADMINISTERED:none  DRAINS:  foley to gravity, JP to bulb    LOCAL MEDICATIONS USED:  MARCAINE     SPECIMEN:  Source of Specimen:  Rt upper mass; Rt lower mass; Final margin upper mass  DISPOSITION OF SPECIMEN:  PATHOLOGY  COUNTS:  YES  TOURNIQUET:  * No tourniquets in log *  DICTATION: .Other Dictation: Dictation Number [id number]  PLAN OF CARE: Admit for overnight observation  PATIENT DISPOSITION:  PACU - hemodynamically stable.   Delay start of Pharmacological VTE agent (>24hrs) due to surgical blood loss or risk of bleeding: yes

## 2023-02-14 NOTE — Transfer of Care (Signed)
Immediate Anesthesia Transfer of Care Note  Patient: Kristin Waller  Procedure(s) Performed: XI ROBOTIC ASSITED PARTIAL NEPHRECTOMY (Right)  Patient Location: PACU  Anesthesia Type:General  Level of Consciousness: awake and patient cooperative  Airway & Oxygen Therapy: Patient Spontanous Breathing and Patient connected to face mask oxygen  Post-op Assessment: Report given to RN, Post -op Vital signs reviewed and stable, and BP 141/51  Pulse 61   Pulse ox 100%  Resp 12  Post vital signs: Reviewed and stable  Last Vitals:  Vitals Value Taken Time  BP    Temp    Pulse    Resp    SpO2      Last Pain:  Vitals:   02/14/23 1043  TempSrc:   PainSc: 0-No pain      Patients Stated Pain Goal: 4 (02/14/23 1043)  Complications: No notable events documented.

## 2023-02-14 NOTE — Anesthesia Procedure Notes (Signed)
Procedure Name: Intubation Date/Time: 02/14/2023 1:45 PM  Performed by: Elyn Peers, CRNAPre-anesthesia Checklist: Patient identified, Emergency Drugs available, Suction available, Patient being monitored and Timeout performed Patient Re-evaluated:Patient Re-evaluated prior to induction Oxygen Delivery Method: Circle system utilized Preoxygenation: Pre-oxygenation with 100% oxygen Induction Type: IV induction Ventilation: Mask ventilation without difficulty Laryngoscope Size: Miller and 3 Grade View: Grade II Tube type: Oral Tube size: 7.0 mm Number of attempts: 1 Airway Equipment and Method: Stylet Placement Confirmation: ETT inserted through vocal cords under direct vision, positive ETCO2 and breath sounds checked- equal and bilateral Secured at: 22 cm Tube secured with: Tape Dental Injury: Teeth and Oropharynx as per pre-operative assessment

## 2023-02-14 NOTE — Anesthesia Preprocedure Evaluation (Addendum)
Anesthesia Evaluation  Patient identified by MRN, date of birth, ID band Patient awake    Reviewed: Allergy & Precautions, NPO status , Patient's Chart, lab work & pertinent test results  Airway Mallampati: I  TM Distance: >3 FB Neck ROM: Full    Dental  (+) Teeth Intact, Dental Advisory Given   Pulmonary neg pulmonary ROS   breath sounds clear to auscultation       Cardiovascular negative cardio ROS  Rhythm:Regular Rate:Normal     Neuro/Psych negative neurological ROS  negative psych ROS   GI/Hepatic negative GI ROS, Neg liver ROS,,,  Endo/Other  negative endocrine ROS    Renal/GU Renal disease     Musculoskeletal negative musculoskeletal ROS (+)    Abdominal   Peds  Hematology negative hematology ROS (+)   Anesthesia Other Findings   Reproductive/Obstetrics                             Anesthesia Physical Anesthesia Plan  ASA: 2  Anesthesia Plan: General   Post-op Pain Management: Tylenol PO (pre-op)*   Induction: Intravenous  PONV Risk Score and Plan: 4 or greater and Ondansetron, Dexamethasone, Scopolamine patch - Pre-op and Midazolam  Airway Management Planned: Oral ETT  Additional Equipment: None  Intra-op Plan:   Post-operative Plan: Extubation in OR  Informed Consent: I have reviewed the patients History and Physical, chart, labs and discussed the procedure including the risks, benefits and alternatives for the proposed anesthesia with the patient or authorized representative who has indicated his/her understanding and acceptance.     Dental advisory given and Interpreter used for interveiw  Plan Discussed with: CRNA  Anesthesia Plan Comments: (- 2 IV's)       Anesthesia Quick Evaluation

## 2023-02-14 NOTE — Discharge Instructions (Signed)
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
## 2023-02-15 ENCOUNTER — Other Ambulatory Visit: Payer: Self-pay

## 2023-02-15 DIAGNOSIS — N2889 Other specified disorders of kidney and ureter: Secondary | ICD-10-CM | POA: Diagnosis not present

## 2023-02-15 LAB — BASIC METABOLIC PANEL
Anion gap: 9 (ref 5–15)
BUN: 13 mg/dL (ref 8–23)
CO2: 23 mmol/L (ref 22–32)
Calcium: 8.3 mg/dL — ABNORMAL LOW (ref 8.9–10.3)
Chloride: 104 mmol/L (ref 98–111)
Creatinine, Ser: 1.04 mg/dL — ABNORMAL HIGH (ref 0.44–1.00)
GFR, Estimated: 59 mL/min — ABNORMAL LOW (ref >60)
Glucose, Bld: 121 mg/dL — ABNORMAL HIGH (ref 70–99)
Potassium: 4.3 mmol/L (ref 3.5–5.1)
Sodium: 136 mmol/L (ref 135–145)

## 2023-02-15 LAB — HEMOGLOBIN AND HEMATOCRIT, BLOOD
HCT: 36.8 % (ref 36.0–46.0)
Hemoglobin: 11.9 g/dL — ABNORMAL LOW (ref 12.0–15.0)
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
## 2023-02-15 NOTE — Progress Notes (Signed)
AVS reviewed w/ daughter & pt. AMN refused by pt - daughter interpreted - work note  provided . No otherquestions at this time - PIV removed x 2

## 2023-02-15 NOTE — Progress Notes (Signed)
Patient reports concerns with discharge, primary team informed.   Will aim for mid-afternoon discharge.

## 2023-02-15 NOTE — Plan of Care (Signed)
  Problem: Education: Goal: Knowledge of the procedure and recovery process will improve Outcome: Progressing   Problem: Bowel/Gastric: Goal: Gastrointestinal status for postoperative course will improve Outcome: Progressing   Problem: Pain Management: Goal: General experience of comfort will improve Outcome: Progressing   Problem: Skin Integrity: Goal: Demonstration of wound healing without infection will improve Outcome: Progressing   Problem: Urinary Elimination: Goal: Ability to avoid or minimize complications of infection will improve Outcome: Progressing Goal: Ability to achieve and maintain urine output will improve Outcome: Progressing Goal: Home care management will improve Outcome: Progressing   Problem: Education: Goal: Knowledge of General Education information will improve Description: Including pain rating scale, medication(s)/side effects and non-pharmacologic comfort measures Outcome: Progressing   Problem: Health Behavior/Discharge Planning: Goal: Ability to manage health-related needs will improve Outcome: Progressing   Problem: Clinical Measurements: Goal: Ability to maintain clinical measurements within normal limits will improve Outcome: Progressing Goal: Will remain free from infection Outcome: Progressing Goal: Diagnostic test results will improve Outcome: Progressing Goal: Respiratory complications will improve Outcome: Progressing Goal: Cardiovascular complication will be avoided Outcome: Progressing   Problem: Activity: Goal: Risk for activity intolerance will decrease Outcome: Progressing   Problem: Nutrition: Goal: Adequate nutrition will be maintained Outcome: Progressing   Problem: Coping: Goal: Level of anxiety will decrease Outcome: Progressing   Problem: Elimination: Goal: Will not experience complications related to bowel motility Outcome: Progressing Goal: Will not experience complications related to urinary  retention Outcome: Progressing   Problem: Pain Management: Goal: General experience of comfort will improve Outcome: Progressing   Problem: Safety: Goal: Ability to remain free from injury will improve Outcome: Progressing   Problem: Skin Integrity: Goal: Risk for impaired skin integrity will decrease Outcome: Progressing

## 2023-02-16 NOTE — Anesthesia Postprocedure Evaluation (Signed)
Anesthesia Post Note  Patient: Kristin Waller  Procedure(s) Performed: XI ROBOTIC ASSITED PARTIAL NEPHRECTOMY (Right)     Patient location during evaluation: PACU Anesthesia Type: General Level of consciousness: awake and alert Pain management: pain level controlled Vital Signs Assessment: post-procedure vital signs reviewed and stable Respiratory status: spontaneous breathing, nonlabored ventilation, respiratory function stable and patient connected to nasal cannula oxygen Cardiovascular status: blood pressure returned to baseline and stable Postop Assessment: no apparent nausea or vomiting Anesthetic complications: no   No notable events documented.              Shelton Silvas

## 2023-02-17 ENCOUNTER — Encounter (HOSPITAL_COMMUNITY): Payer: Self-pay | Admitting: Urology

## 2023-02-18 LAB — SURGICAL PATHOLOGY

## 2023-03-14 ENCOUNTER — Telehealth: Payer: Self-pay | Admitting: Oncology

## 2023-03-14 NOTE — Telephone Encounter (Signed)
Called and left message for patient on daughter contact info. Left message stating new appointment date. Advise rescheduling appointment on 12/12 will be moved to day 12/19 @3p  with dr visit.

## 2023-03-27 ENCOUNTER — Telehealth: Payer: Self-pay | Admitting: Oncology

## 2023-03-27 ENCOUNTER — Ambulatory Visit: Payer: Medicare Other | Admitting: Oncology

## 2023-04-03 ENCOUNTER — Other Ambulatory Visit: Payer: Medicare Other

## 2023-04-03 ENCOUNTER — Ambulatory Visit: Payer: Medicare Other | Admitting: Oncology

## 2023-08-25 DIAGNOSIS — D1771 Benign lipomatous neoplasm of kidney: Secondary | ICD-10-CM | POA: Diagnosis not present

## 2023-08-28 DIAGNOSIS — D1771 Benign lipomatous neoplasm of kidney: Secondary | ICD-10-CM | POA: Diagnosis not present

## 2023-08-28 DIAGNOSIS — N289 Disorder of kidney and ureter, unspecified: Secondary | ICD-10-CM | POA: Diagnosis not present

## 2023-09-01 DIAGNOSIS — D1771 Benign lipomatous neoplasm of kidney: Secondary | ICD-10-CM | POA: Diagnosis not present

## 2023-09-05 ENCOUNTER — Telehealth: Payer: Self-pay | Admitting: Oncology

## 2023-09-05 NOTE — Telephone Encounter (Signed)
 I attempted to reach Kristin Waller to schedule a follow appointment with Dr. Randye Buttner per a staff message received on 5/20.

## 2023-09-11 ENCOUNTER — Telehealth: Payer: Self-pay | Admitting: Oncology

## 2023-09-11 NOTE — Telephone Encounter (Signed)
 I left a detailed message for patient to return my call if she needs to reschedule her appointment. I was directed by Dr. Randye Buttner through staff messaging on 5/20 to have Kristin Waller scheduled for a follow up in mid June.

## 2023-09-15 NOTE — Telephone Encounter (Signed)
 Kristin Waller

## 2023-10-01 ENCOUNTER — Inpatient Hospital Stay

## 2023-10-01 ENCOUNTER — Encounter: Payer: Self-pay | Admitting: Oncology

## 2023-10-01 ENCOUNTER — Other Ambulatory Visit: Payer: Self-pay

## 2023-10-01 ENCOUNTER — Inpatient Hospital Stay: Attending: Oncology | Admitting: Oncology

## 2023-10-01 VITALS — BP 130/65 | HR 62 | Temp 97.7°F | Resp 17 | Ht 60.0 in | Wt 176.3 lb

## 2023-10-01 DIAGNOSIS — Z86018 Personal history of other benign neoplasm: Secondary | ICD-10-CM | POA: Diagnosis not present

## 2023-10-01 DIAGNOSIS — N289 Disorder of kidney and ureter, unspecified: Secondary | ICD-10-CM

## 2023-10-01 DIAGNOSIS — E042 Nontoxic multinodular goiter: Secondary | ICD-10-CM | POA: Insufficient documentation

## 2023-10-01 DIAGNOSIS — K862 Cyst of pancreas: Secondary | ICD-10-CM | POA: Insufficient documentation

## 2023-10-01 DIAGNOSIS — K769 Liver disease, unspecified: Secondary | ICD-10-CM | POA: Diagnosis not present

## 2023-10-01 DIAGNOSIS — K7689 Other specified diseases of liver: Secondary | ICD-10-CM | POA: Diagnosis not present

## 2023-10-01 LAB — CBC WITH DIFFERENTIAL (CANCER CENTER ONLY)
Abs Immature Granulocytes: 0.01 10*3/uL (ref 0.00–0.07)
Basophils Absolute: 0 10*3/uL (ref 0.0–0.1)
Basophils Relative: 0 %
Eosinophils Absolute: 0.1 10*3/uL (ref 0.0–0.5)
Eosinophils Relative: 2 %
HCT: 39 % (ref 36.0–46.0)
Hemoglobin: 13.1 g/dL (ref 12.0–15.0)
Immature Granulocytes: 0 %
Lymphocytes Relative: 34 %
Lymphs Abs: 1.8 10*3/uL (ref 0.7–4.0)
MCH: 29 pg (ref 26.0–34.0)
MCHC: 33.6 g/dL (ref 30.0–36.0)
MCV: 86.5 fL (ref 80.0–100.0)
Monocytes Absolute: 0.3 10*3/uL (ref 0.1–1.0)
Monocytes Relative: 6 %
Neutro Abs: 3 10*3/uL (ref 1.7–7.7)
Neutrophils Relative %: 58 %
Platelet Count: 194 10*3/uL (ref 150–400)
RBC: 4.51 MIL/uL (ref 3.87–5.11)
RDW: 14.2 % (ref 11.5–15.5)
WBC Count: 5.3 10*3/uL (ref 4.0–10.5)
nRBC: 0 % (ref 0.0–0.2)

## 2023-10-01 LAB — CMP (CANCER CENTER ONLY)
ALT: 13 U/L (ref 0–44)
AST: 16 U/L (ref 15–41)
Albumin: 4.1 g/dL (ref 3.5–5.0)
Alkaline Phosphatase: 49 U/L (ref 38–126)
Anion gap: 5 (ref 5–15)
BUN: 19 mg/dL (ref 8–23)
CO2: 29 mmol/L (ref 22–32)
Calcium: 9.2 mg/dL (ref 8.9–10.3)
Chloride: 105 mmol/L (ref 98–111)
Creatinine: 1.08 mg/dL — ABNORMAL HIGH (ref 0.44–1.00)
GFR, Estimated: 56 mL/min — ABNORMAL LOW (ref >60)
Glucose, Bld: 147 mg/dL — ABNORMAL HIGH (ref 70–99)
Potassium: 4.3 mmol/L (ref 3.5–5.1)
Sodium: 139 mmol/L (ref 135–145)
Total Bilirubin: 0.3 mg/dL (ref 0.0–1.2)
Total Protein: 7.4 g/dL (ref 6.5–8.1)

## 2023-10-01 LAB — LACTATE DEHYDROGENASE: LDH: 124 U/L (ref 98–192)
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
## 2023-10-01 NOTE — Assessment & Plan Note (Addendum)
-   Please review HPI for additional details.    On 01/05/2023, MRI of the abdomen and pelvis showed heterogeneously enhancing exophytic mass arising from the peripheral inferior pole of right kidney measuring 1.5 x 1.3 cm.  Additional heterogeneously enhancing partially exophytic mass arising from anterior midportion of the right kidney measuring 1.5 x 1.4 cm.  These are consistent with small renal cell carcinomas.  No evidence of renal invasion, lymphadenopathy or metastatic disease in the abdomen. Fluid signal cystic lesions of the pancreas measuring 1 cm in the pancreatic head as well as additional subcentimeter lesions, consistent with small IPMNs.As there is no observed increased risk of malignancy for such lesions smaller than 2 cm, no specific further follow-up or characterization was recommended.   She was referred to urology. Dr. Secundino Dach performed right partial nephrectomy x 2 on 02/15/2024 and intraoperative ultrasound.  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  Routine labs including CBC, CMP, LDH were all unremarkable.   Discussed the benign nature of the angiomyolipomas.  She apparently had CT scan in May 2025 with urology office but showed no evidence of disease recurrence.  No additional intervention is needed.  Patient was provided reassurance.  Benign cyst identified in the tail of the pancreas and in the liver on MRI. No intervention required at this time. Monitor during future scans for kidney.   Kidney function is normal with creatinine level at 1.08. Advised to maintain hydration and avoid nephrotoxic medications such as NSAIDs. Tylenol  is recommended for analgesia if needed. - Maintain hydration. - Avoid NSAIDs such as Aleve, Advil, and Motrin. - Use Tylenol  for analgesia if needed.  Since no additional oncologic intervention or workup is needed, she can be discharged from our office for continued follow-up with her PCP.  Please  reconsult us  as necessary.
